# Patient Record
Sex: Male | Born: 1940 | Race: White | Hispanic: No | Marital: Single | Smoking: Former smoker
Health system: Southern US, Community
[De-identification: ages and names within clinical notes are randomized; demographics above are authoritative.]

## PROBLEM LIST (undated history)

## (undated) DIAGNOSIS — K219 Gastro-esophageal reflux disease without esophagitis: Secondary | ICD-10-CM

## (undated) DIAGNOSIS — C44601 Unspecified malignant neoplasm of skin of unspecified upper limb, including shoulder: Secondary | ICD-10-CM

## (undated) DIAGNOSIS — M1990 Unspecified osteoarthritis, unspecified site: Secondary | ICD-10-CM

## (undated) DIAGNOSIS — J449 Chronic obstructive pulmonary disease, unspecified: Secondary | ICD-10-CM

## (undated) DIAGNOSIS — R011 Cardiac murmur, unspecified: Secondary | ICD-10-CM

## (undated) DIAGNOSIS — IMO0002 Reserved for concepts with insufficient information to code with codable children: Secondary | ICD-10-CM

## (undated) DIAGNOSIS — I2510 Atherosclerotic heart disease of native coronary artery without angina pectoris: Secondary | ICD-10-CM

## (undated) DIAGNOSIS — I219 Acute myocardial infarction, unspecified: Secondary | ICD-10-CM

## (undated) DIAGNOSIS — I10 Essential (primary) hypertension: Secondary | ICD-10-CM

## (undated) HISTORY — PX: CHOLECYSTECTOMY: SHX55

## (undated) HISTORY — DX: Atherosclerotic heart disease of native coronary artery without angina pectoris: I25.10

## (undated) HISTORY — DX: Unspecified osteoarthritis, unspecified site: M19.90

## (undated) HISTORY — DX: Essential (primary) hypertension: I10

## (undated) HISTORY — DX: Gastro-esophageal reflux disease without esophagitis: K21.9

## (undated) HISTORY — DX: Cardiac murmur, unspecified: R01.1

## (undated) HISTORY — DX: Chronic obstructive pulmonary disease, unspecified: J44.9

## (undated) HISTORY — DX: Acute myocardial infarction, unspecified: I21.9

## (undated) HISTORY — DX: Reserved for concepts with insufficient information to code with codable children: IMO0002

## (undated) HISTORY — DX: Unspecified malignant neoplasm of skin of unspecified upper limb, including shoulder: C44.601

---
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
## 1973-08-16 HISTORY — PX: HERNIA REPAIR: SHX51

## 1980-08-16 HISTORY — PX: SHOULDER SURGERY: SHX246

## 1994-08-16 HISTORY — PX: FINGER SURGERY: SHX640

## 2005-01-10 HISTORY — PX: ZENKER'S DIVERTICULECTOMY: SHX5223

## 2006-11-11 HISTORY — PX: CORONARY ANGIOPLASTY WITH STENT PLACEMENT: SHX49

## 2008-07-16 HISTORY — PX: TOTAL HIP ARTHROPLASTY: SHX124

## 2008-07-23 ENCOUNTER — Inpatient Hospital Stay (HOSPITAL_COMMUNITY): Admission: RE | Admit: 2008-07-23 | Discharge: 2008-07-29 | Payer: Self-pay | Admitting: Orthopedic Surgery

## 2009-10-20 ENCOUNTER — Encounter: Payer: Self-pay | Admitting: Pulmonary Disease

## 2009-10-23 ENCOUNTER — Encounter: Payer: Self-pay | Admitting: Pulmonary Disease

## 2010-03-23 ENCOUNTER — Encounter: Payer: Self-pay | Admitting: Pulmonary Disease

## 2010-03-25 ENCOUNTER — Encounter: Payer: Self-pay | Admitting: Pulmonary Disease

## 2010-04-08 ENCOUNTER — Ambulatory Visit: Payer: Self-pay | Admitting: Pulmonary Disease

## 2010-04-08 DIAGNOSIS — J189 Pneumonia, unspecified organism: Secondary | ICD-10-CM | POA: Insufficient documentation

## 2010-04-08 DIAGNOSIS — I10 Essential (primary) hypertension: Secondary | ICD-10-CM | POA: Insufficient documentation

## 2010-04-08 DIAGNOSIS — J438 Other emphysema: Secondary | ICD-10-CM

## 2010-04-08 DIAGNOSIS — I219 Acute myocardial infarction, unspecified: Secondary | ICD-10-CM | POA: Insufficient documentation

## 2010-04-08 LAB — CONVERTED CEMR LAB
BUN: 10 mg/dL (ref 6–23)
CO2: 30 meq/L (ref 19–32)
Chloride: 102 meq/L (ref 96–112)
Creatinine, Ser: 1 mg/dL (ref 0.4–1.5)
Glucose, Bld: 99 mg/dL (ref 70–99)
Potassium: 4.7 meq/L (ref 3.5–5.1)

## 2010-04-21 ENCOUNTER — Encounter: Payer: Self-pay | Admitting: Pulmonary Disease

## 2010-04-24 ENCOUNTER — Telehealth (INDEPENDENT_AMBULATORY_CARE_PROVIDER_SITE_OTHER): Payer: Self-pay | Admitting: *Deleted

## 2010-05-04 ENCOUNTER — Encounter: Payer: Self-pay | Admitting: Pulmonary Disease

## 2010-05-04 ENCOUNTER — Ambulatory Visit: Payer: Self-pay | Admitting: Pulmonary Disease

## 2010-06-22 ENCOUNTER — Ambulatory Visit: Payer: Self-pay | Admitting: Pulmonary Disease

## 2010-06-22 DIAGNOSIS — R042 Hemoptysis: Secondary | ICD-10-CM | POA: Insufficient documentation

## 2010-06-30 ENCOUNTER — Encounter: Payer: Self-pay | Admitting: Pulmonary Disease

## 2010-06-30 ENCOUNTER — Ambulatory Visit (HOSPITAL_COMMUNITY)
Admission: RE | Admit: 2010-06-30 | Discharge: 2010-06-30 | Payer: Self-pay | Source: Home / Self Care | Admitting: Pulmonary Disease

## 2010-07-01 ENCOUNTER — Telehealth: Payer: Self-pay | Admitting: Pulmonary Disease

## 2010-07-06 ENCOUNTER — Ambulatory Visit: Payer: Self-pay | Admitting: Pulmonary Disease

## 2010-09-15 NOTE — Assessment & Plan Note (Signed)
Summary: 2 week follow up with cxr//jwr   Visit Type:  Follow-up Copy to:  pcp Primary Provider/Referring Provider:  Dr. Elana Alm   CC:  2 week follow up. Wesley [name]  History of Present Illness: 70/M, remote smoker ,quit '85 with mod hiatal hernia & recurrent  LLL infiltrate since march 2011. He presented on 10/06/09 with hemoptysis  & LLL infiltrate. CXR on 10/20/09 showed improved but persistent LLL ASD + effusion. CTc hest on 10/23/09 showed ill defined nodular opacities in LLL & LUL with small effusion  CT angio in dec '09 also shows BLL opacities with some chronicity s/o underlying fibrosis  Initital OV - 04/08/10 He returned on 03/25/10 with Lt chest pain & CXR showed persitent infx LLL -improved from prior but not resolved - given levaquin CT chest  sep '09- improved infiltrates with residual scar like markings s/o resolved inflammation/ infection Spirometry showed midl airway obstruction with FEV1 69% has been on advair & spiriva x many years.   July 06, 2010 10:02 AM  Returned on nov 7'11 with chills, hemoptysis, LLL infiltrate  - improved with levaquin on FU CXR. feels much better today, back to baseline swallowing evaln showed zenker's diverticulum, this was endoscopically repaired ? from pt's description by an ENT MD , he does not remember name. I offered him GI opinion but he is reluctant, wants to wait another month & go to see this ENt physician.  Preventive Screening-Counseling & Management  Alcohol-Tobacco     Smoking Status: quit     Packs/Day: 2.5     Year Started: 1957     Year Quit: 1985  Current Medications (verified): 1)  Advair Diskus 250-50 Mcg/dose Aepb (Fluticasone-Salmeterol) .... Inhale 1 Puff Two Times A Day 2)  Spiriva Handihaler 18 Mcg Caps (Tiotropium Bromide Monohydrate) .... Once Daily 3)  Iron 28 Mg Tabs (Ferrous Sulfate) .... Take 1 Tablet By Mouth Once A Day 4)  Diltiazem Hcl Er Beads 240 Mg Xr24h-Cap (Diltiazem Hcl Er Beads) .... Take 1 Tablet By Mouth  Once A Day 5)  Plavix 75 Mg Tabs (Clopidogrel Bisulfate) .... Take 1 Tablet By Mouth Once A Day 6)  Doxazosin Mesylate 2 Mg Tabs (Doxazosin Mesylate) .... Take 1 Tablet By Mouth Once A Day 7)  Omeprazole 20 Mg Cpdr (Omeprazole) .... Take 1 Tablet By Mouth Once A Day 8)  Aspirin 325 Mg  Tabs (Aspirin) .... Take 1 Tablet By Mouth Once A Day 9)  Singulair 10 Mg Tabs (Montelukast Sodium) .... Take 1 Tablet By Mouth Once A Day 10)  Lipitor 20 Mg Tabs (Atorvastatin Calcium) .... Take 1 Tablet By Mouth Once A Day 11)  Furosemide 20 Mg Tabs (Furosemide) .... Take 1 Tablet By Mouth Once A Day 12)  Calcium-D 600-200 Mg-Unit Tabs (Calcium Carbonate-Vitamin D) .... Take 1 Tablet By Mouth Two Times A Day 13)  Forteo 600 Mcg/2.38ml Soln (Teriparatide (Recombinant)) .... At Bedtime 14)  Glucosamine 500 Mg Caps (Glucosamine Sulfate) .... 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   Review of Systems  The patient denies anorexia, fever, weight loss, weight gain, vision loss, decreased hearing, hoarseness, chest pain, syncope, dyspnea on exertion, peripheral edema, prolonged cough, headaches, hemoptysis,  abdominal pain, melena, hematochezia, severe indigestion/heartburn, hematuria, muscle weakness, suspicious skin lesions, transient blindness, difficulty walking, depression, unusual weight change, abnormal bleeding, enlarged lymph nodes, and angioedema.    Vital Signs:  Patient profile:   70 year old male Height:      66 inches Weight:      187.4 pounds BMI:     30.36 O2 Sat:      96 % on Room air Temp:     97.7 degrees F oral Pulse  rate:   71 / minute BP sitting:   130 / 78  (left arm) Cuff size:   regular  Vitals Entered By: Zackery Barefoot CMA (July 06, 2010 9:10 AM)  O2 Flow:  Room air CC: 2 week follow up.  Comments Medications reviewed with patient Verified contact number and pharmacy with patient Zackery Barefoot CMA  July 06, 2010 9:11 AM    Physical Exam  Additional Exam:  Gen. Pleasant, well-nourished, in no distress ENT - no lesions, no post nasal drip Neck: No JVD, no thyromegaly, no carotid bruits Lungs: no use of accessory muscles, no dullness to percussion, lt LL rales, nor rhonchi  Cardiovascular: Rhythm regular, heart sounds  normal, no murmurs or gallops, no peripheral edema Musculoskeletal: No deformities, no cyanosis or clubbing      Impression & Recommendations:  Problem # 1:  PNEUMONIA, ORGAN UNSPECIFIED (ICD-486)  Recurrent pna esp LLL  Modified Ba swallow points to zenker's diverticulum as cause , rather than reflux/ hiatal hernia Recommend GI evaluation but he would prefer to see his old ENT surgeon - I could not persuade him , emphasised importance of this multiple times.  Orders: Est. Patient Level III (16109)  Problem # 2:  EMPHYSEMA (ICD-492.8)  gold stg II CT scan suggests underlying lung disease ? fibrosis  Orders: Est. Patient Level III (60454)  Medications Added to Medication List This Visit: 1)  Glucosamine 500 Mg Caps (Glucosamine sulfate) .... Take 1 tablet by mouth once a day  Patient Instructions: 1)  Copy sent to: Dr Elana Alm 2)  Please schedule a follow-up appointment in 3-4 months. 3)  XRay is improving 4)  I would recommend that you get evaluation from a gastro-enterologist for Zenker's diverticulum - this is likley causing your recurrent pneumonia

## 2010-09-15 NOTE — Assessment & Plan Note (Signed)
Summary: HEMOPTYSIS///KP   Visit Type:  Sick Visit Copy to:  pcp Primary Provider/Referring Provider:  Dr. Elana Alm   CC:  Pt c/o nausea, vomiting, "dry heaves", spitting up blood, fever, chills, starting Saturday. Also c/o left lower back pain, productive cough with light green mucus mixed with blood, and increased SOB all the time. Started Mucinex yesterday.  History of Present Illness: 69/M, remote smoker ,quit '85 with mod hiatal hernia & recurrent  LLL infiltrate since march 2011. He presented on 10/06/09 with hemoptysis  & LLL infiltrate. CXR on 10/20/09 showed improved but persistent LLL ASD + effusion. CTc hest on 10/23/09 showed ill defined nodular opacities in LLL & LUL with small effusion  CT angio in dec '09 also shows BLL opacities with some chronicity s/o underlying fibrosis  Initital OV - 04/08/10 He returned on 03/25/10 with Lt chest pain & CXR showed persitent infx LLL -improved from prior but not resolved - given levaquin has been on advair & spiriva x many years. 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 Also c/o left lower back pain, productive cough with light green mucus mixed with blood, and increased SOB all the time. Started Mucinex yesterday. CXR - LLL infx  Preventive Screening-Counseling & Management  Alcohol-Tobacco     Smoking Status: quit     Packs/Day: 2.5     Year Started: 1958     Year Quit: 1985  Current Medications (verified): 1)  Advair Diskus 250-50 Mcg/dose Aepb (Fluticasone-Salmeterol) .... Inhale 1 Puff Two Times A Day 2)  Spiriva Handihaler 18 Mcg Caps (Tiotropium Bromide Monohydrate) .... Once Daily 3)  Iron 28 Mg Tabs (Ferrous Sulfate) .... Take  1 Tablet By Mouth Once A Day 4)  Diltiazem Hcl Er Beads 240 Mg Xr24h-Cap (Diltiazem Hcl Er Beads) .... Take 1 Tablet By Mouth Once A Day 5)  Plavix 75 Mg Tabs (Clopidogrel Bisulfate) .... Take 1 Tablet By Mouth Once A Day 6)  Doxazosin Mesylate 2 Mg Tabs (Doxazosin Mesylate) .... Take 1 Tablet By Mouth Once A Day 7)  Omeprazole 20 Mg Cpdr (Omeprazole) .... Take 1 Tablet By Mouth Once A Day 8)  Aspirin 325 Mg  Tabs (Aspirin) .... Take 1 Tablet By Mouth Once A Day 9)  Singulair 10 Mg Tabs (Montelukast Sodium) .... Take 1 Tablet By Mouth Once A Day 10)  Lipitor 20 Mg Tabs (Atorvastatin Calcium) .... Take 1 Tablet By Mouth Once A Day 11)  Furosemide 20 Mg Tabs (Furosemide) .... Take 1 Tablet By Mouth Once A Day 12)  Calcium-D 600-200 Mg-Unit Tabs (Calcium Carbonate-Vitamin D) .... Take 1 Tablet By Mouth Two Times A Day 13)  Forteo 600 Mcg/2.33ml Soln (Teriparatide (Recombinant)) .... 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   Review of Systems       The patient complains of fever, dyspnea on exertion, and prolonged cough.  The patient denies anorexia, weight loss, weight gain, vision loss, decreased hearing, hoarseness, chest pain,  syncope, peripheral edema, headaches, hemoptysis, abdominal pain, melena, hematochezia, severe indigestion/heartburn, hematuria, muscle weakness, suspicious skin lesions, difficulty walking, depression, unusual weight change, abnormal bleeding, enlarged lymph nodes, and angioedema.    Vital Signs:  Patient profile:   70 year old male Height:      66 inches Weight:      187.4 pounds BMI:     30.36 O2 Sat:      95 % on  Room air Temp:     98.6 degrees F oral Pulse rate:   88 / minute BP sitting:   120 / 68  (left arm) Cuff size:   regular  Vitals Entered By: Zackery Barefoot CMA (June 22, 2010 2:24 PM)  O2 Flow:  Room air CC: Pt c/o nausea, vomiting, "dry heaves", spitting up blood, fever, chills, starting Saturday. Also c/o left lower back pain, productive cough with light green mucus mixed with blood, increased SOB all the time. Started Mucinex yesterday Comments Medications reviewed with patient Verified contact number and pharmacy with patient Zackery Barefoot CMA  June 22, 2010 2:24 PM    Physical Exam  Additional Exam:  Gen. Pleasant, well-nourished, in no distress ENT - no lesions, no post nasal drip Neck: No JVD, no thyromegaly, no carotid bruits Lungs: no use of accessory muscles, no dullness to percussion, lt LL rales, nor rhonchi  Cardiovascular: Rhythm regular, heart sounds  normal, no murmurs or gallops, no peripheral edema Musculoskeletal: No deformities, no cyanosis or clubbing      CXR  Procedure date:  06/22/2010  Findings:      IMPRESSION:   1.  New multifocal air space opacities in the left lung may represent pneumonia or aspirated blood.  Radiographic followup is recommended to exclude underlying neoplasm. 2.  No evidence of pleural effusion or adenopathy.  Impression & Recommendations:  Problem # 1:  PNEUMONIA, ORGAN UNSPECIFIED (ICD-486) Recurrent pna esp LLL - will r/o susual suspects - sinuses, aspiration - If neg - consider inspection bscopy (will have to stop plavix prior) Modified Ba swallow evaln for hiatal hernia CT scan suggests underlying lung disease ? fibrosis His updated medication list for this problem includes:    Levaquin 500 Mg Tabs (Levofloxacin) ..... Once daily  Orders: Speech Therapy (Speech Therapy) Est. Patient Level IV (16109) Prescription Created Electronically [phone])  Medications Added to Medication List This Visit: 1)   Levaquin 500 Mg Tabs (Levofloxacin) .... Once daily  Other Orders: T-2 View CXR ([id number])  Patient Instructions: 1)  Copy sent to: Dr Elana Alm 2)  Please schedule a follow-up appointment in 2 weeks with CXR 3)  Swallow testing 4)  We will check your swallowing & your sinuses (in that order ) to find the cause of recurrent pneumonia Prescriptions: LEVAQUIN 500 MG TABS (LEVOFLOXACIN) once daily  #10 x 0   Entered and Authorized by:   Comer Locket Vassie Loll MD   Signed by:   Comer Locket Vassie Loll MD on 06/22/2010   Method used:   Electronically to        [facility] (retail)       [address]       [place], [place]  [address]       Ph: [phone]       Fax: [phone]   RxID:   [id number]

## 2010-09-15 NOTE — Progress Notes (Signed)
Summary: ct disc-  Phone Note Call from Patient Call back at Home Phone [phone]   Caller: Patient Call For: alva Summary of Call: Pt states he had a chest ct on 9/6, wants to know if he needs to bring disc in before his appt on 9/19, pls advise. Initial call taken by: Darletta Moll,  April 24, 2010 1:19 PM  Follow-up for Phone Call        [facility] Vernie Murders  April 24, 2010 1:48 PM   Additional Follow-up for Phone Call Additional follow up Details #1::        Spoke with pt and notified okay to wait until ov with RA to bring the disc.  Pt verbalized understanding. Additional Follow-up by: Vernie Murders,  April 24, 2010 2:08 PM

## 2010-09-15 NOTE — Assessment & Plan Note (Signed)
Summary: f/u CT ///kp   Visit Type:  Follow-up Copy to:  pcp Primary Provider/Referring Provider:  Dr. Elana Alm   CC:  Pt here for follow up of CT done at [facility] .  History of Present Illness: Initital OV - 04/08/10 68/M, remote smoker ,quit '85 presents for evaluation of persistent LLL infiltrate since march 2011. He presented on 10/06/09 with hemoptysis  & LLL infiltrate. CXR on 10/20/09 showed improved but persistent LLL ASD + effusion. CTc hest on 10/23/09 showed ill defined nodular opacities in LLL & LUL with small effusion - favoring infecetion/ inflammation  & short term FU was recommended He returned on 03/25/10 with Lt chest pain & CXR showed persitent infx LLL -improved from prior but not resolved - given levaquin, CTc hest recommended but pt had no money - referred to [place] for further evaluation. has been on advair & spiriva x many years. 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   Review of Systems       The patient complains of dyspnea on exertion.  The patient denies anorexia, fever, weight loss, weight gain, vision loss, decreased hearing, hoarseness, chest pain, syncope, peripheral edema, prolonged cough, headaches,  hemoptysis, abdominal pain, melena, hematochezia, severe indigestion/heartburn, hematuria, muscle weakness, suspicious skin lesions, difficulty walking, depression, unusual weight change, abnormal bleeding, enlarged lymph nodes, and angioedema.    Vital Signs:  Patient profile:   70 year old male Height:      66 inches Weight:      186.38 pounds BMI:     30.19 O2 Sat:      95 % on Room air Temp:     97.8 degrees F oral Pulse rate:   82 / minute BP sitting:   136 / 72  (left arm) Cuff size:   regular  Vitals Entered By: Zackery Barefoot CMA (May 04, 2010 11:24 AM)  O2 Flow:  Room air CC: Pt here for follow up of CT done at [facility]  Comments Medications reviewed with patient Verified contact number and pharmacy with patient Zackery Barefoot CMA  May 04, 2010 11:25 AM    Physical Exam  Additional Exam:  Gen. Pleasant, well-nourished, in no distress ENT - no lesions, no post nasal drip Neck: No JVD, no thyromegaly, no carotid bruits Lungs: no use of accessory muscles, no dullness to percussion, clear without rales or rhonchi  Cardiovascular: Rhythm regular, heart sounds  normal, no murmurs or gallops, no peripheral edema Musculoskeletal: No deformities, no cyanosis or clubbing      Impression & Recommendations:  Problem # 1:  PNEUMONIA, ORGAN UNSPECIFIED (ICD-486)  -resolved Unclear cause for recurrence in aug'11 following initial episode in feb'11 If recurs, again would investigate sinuses, consider cutting down advair to off   Orders: Est. Patient Level  III (84132) Spirometry w/Graph (94010)  Problem # 2:  EMPHYSEMA (ICD-492.8)  spirometry today for baseline lung function ct advair spiriva flu shot  pneumovax 2010 (per pt)  Orders: Est. Patient Level III (44010) Spirometry w/Graph (94010)  Other Orders: Flu Vaccine 54yrs + (27253) Administration Flu vaccine - MCR (G6440)  Patient Instructions: 1)  Copy sent to: Dr Elana Alm 2)  Please  schedule a follow-up appointment as needed. 3)  Spirometry today    Flu Vaccine Consent Questions     Do you have a history of severe allergic reactions to this vaccine? no    Any prior history of allergic reactions to egg and/or gelatin? no    Do you have a sensitivity to the preservative Thimersol? no    Do you have a past history of Guillan-Barre Syndrome? no    Do you currently have an acute febrile illness? no    Have you ever had a severe reaction to latex? no    Vaccine information given and explained to patient? yes    Are you currently pregnant? no    Lot Number:[id number]   Exp Date:02/12/2010   Site Given  Left Deltoid IMedflu  Elray Buba RN  May 04, 2010 12:07 PM

## 2010-09-15 NOTE — Assessment & Plan Note (Signed)
Summary: PERSISTANT LEGION ON CXR ///kp   Visit Type:  Initial Consult Copy to:  pcp Primary Provider/Referring Provider:  Dr. Elana Alm   CC:  Pt here for pulmonary consult. Abnormal cxr.  History of Present Illness: Initital OV - 04/08/10 68/M, remote smoker ,quit '85 presents for evaluation of persistent LLL infiltrate since march 2011. He presented on 10/06/09 with hemoptysis  & LLL infiltrate. CXR on 10/20/09 showed improved but persistent LLL ASD + effusion. CTc hest on 10/23/09 showed ill defined nodular opacities in LLL & LUL with small effusion - favoring infecetion/ inflammation  & short term FU was recommended He returned on 03/25/10 with Lt chest pain & CXR showed persitent infx LLL -improved from prior but not resolved - given levaquin, CTc hest recommended but pt had no money - referred to [place] for further evaluation. He did not desaturate on exertion, denies wt loss or hemoptysis   Preventive Screening-Counseling & Management  Alcohol-Tobacco     Smoking Status: quit     Packs/Day: 2.5     Year Started: 1958     Year Quit: 1985  Current Medications (verified): 1)  Advair Diskus 250-50 Mcg/dose Aepb (Fluticasone-Salmeterol) .... Inhale 1 Puff Two Times A Day 2)  Spiriva Handihaler 18 Mcg Caps (Tiotropium Bromide Monohydrate) .... Once Daily 3)  Iron 28 Mg Tabs (Ferrous Sulfate) .... Take 1 Tablet By Mouth Once A Day 4)  Diltiazem Hcl Er Beads 240 Mg Xr24h-Cap (Diltiazem Hcl Er Beads) .... Take 1 Tablet By Mouth Once A Day 5)  Plavix 75 Mg Tabs (Clopidogrel Bisulfate) .... Take 1 Tablet By Mouth Once A Day 6)  Doxazosin Mesylate 2 Mg Tabs (Doxazosin Mesylate) .... Take 1 Tablet By Mouth Once A Day 7)  Omeprazole 20 Mg Cpdr (Omeprazole) .... Take 1 Tablet By Mouth Once A Day 8)  Aspirin 325 Mg  Tabs (Aspirin) .... Take 1 Tablet By Mouth Once A Day 9)  Singulair 10 Mg Tabs (Montelukast Sodium) .... Take 1 Tablet By Mouth Once A Day 10)  Lipitor 20 Mg Tabs (Atorvastatin Calcium)  .... Take 1 Tablet By Mouth Once A Day 11)  Furosemide 20 Mg Tabs (Furosemide) .... Take 1 Tablet By Mouth Once A Day 12)  Calcium-D 600-200 Mg-Unit Tabs (Calcium Carbonate-Vitamin D) .... Take 1 Tablet By Mouth Two Times A Day 13)  Forteo 600 Mcg/2.20ml Soln (Teriparatide (Recombinant)) .... 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  Smoking Status:  quit Packs/Day:  2.5  Review of Systems       The patient complains of shortness of breath with activity, productive cough, non-productive cough, acid heartburn, and indigestion.  The patient denies shortness of breath at rest, coughing up blood, chest pain, irregular heartbeats, loss of appetite, weight change, abdominal pain, difficulty swallowing, sore throat, tooth/dental problems, headaches, nasal congestion/difficulty breathing through nose, sneezing, itching, ear ache, anxiety, depression, hand/feet swelling, joint stiffness or pain, rash, change in color of mucus, and fever.    Vital Signs:  Patient profile:  70 year old male Height:      66 inches Weight:      187 pounds BMI:     30.29 O2 Sat:      98 % on Room air Temp:     98.4 degrees F oral Pulse rate:   76 / minute BP sitting:   130 / 80  (right arm) Cuff size:   regular  Vitals Entered By: Zackery Barefoot CMA (April 08, 2010 2:21 PM)  O2 Flow:  Room  air  Serial Vital Signs/Assessments:  Comments: Ambulatory Pulse Oximetry  Resting; HR__86___    02 Sat__95%RA___  Lap1 (185 feet)   HR__95___   02 Sat__93%RA___ Lap2 (185 feet)   HR__99___   02 Sat__93%RA___    Lap3 (185 feet)   HR__105___   02 Sat_95%RA____  _X__Test Completed without Difficulty Zackery Barefoot CMA  April 08, 2010 3:04 PM  ___Test Stopped due to:   By: Zackery Barefoot CMA   CC: Pt here for pulmonary consult. Abnormal cxr Comments Medications reviewed with patient Verified contact number and pharmacy with patient Zackery Barefoot CMA  April 08, 2010 2:21 PM     Impression & Recommendations:  Problem # 1:  PNEUMONIA, ORGAN UNSPECIFIED (ICD-486) Persitent since feb 2011  Rpt CT chest to assess & comapre with scan from [place] - if persistent , further investigate incl perhaps bscopy. Orders: Radiology Referral (Radiology) TLB-BMP (Basic Metabolic Panel-BMET) ([id number]) Consultation Level III (04540)  Medications Added to Medication List This Visit: 1)  Advair Diskus 250-50 Mcg/dose Aepb (Fluticasone-salmeterol) .... Inhale 1 puff two times a day 2)  Spiriva Handihaler 18 Mcg Caps (Tiotropium bromide monohydrate) .... Once daily 3)  Iron 28 Mg Tabs (Ferrous sulfate) .... Take 1 tablet by mouth once a day 4)  Diltiazem Hcl Er Beads 240 Mg Xr24h-cap (Diltiazem hcl er beads) .... Take 1 tablet by mouth once a day 5)  Plavix 75 Mg Tabs (Clopidogrel bisulfate) .... Take 1 tablet by mouth once a day 6)  Doxazosin Mesylate 2 Mg Tabs (Doxazosin mesylate) .... Take 1 tablet by mouth once a day 7)  Omeprazole 20 Mg Cpdr (Omeprazole) .... Take 1 tablet by mouth once a day 8)  Aspirin 325 Mg Tabs (Aspirin) .... Take 1 tablet by mouth once a day 9)  Singulair 10 Mg Tabs (Montelukast sodium) .... Take 1 tablet by mouth once a day 10)  Lipitor 20 Mg Tabs (Atorvastatin calcium) .... Take 1 tablet by mouth once a day 11)  Furosemide 20 Mg Tabs (Furosemide) ....  Take 1 tablet by mouth once a day 12)  Calcium-d 600-200 Mg-unit Tabs (Calcium carbonate-vitamin d) .... Take 1 tablet by mouth two times a day 13)  Forteo 600 Mcg/2.47ml Soln (Teriparatide (recombinant)) .... At bedtime  Patient Instructions: 1)  Please schedule a follow-up appointment in 1 week after CT scan 2)  Ambulatory satn 3)  You have patchy pneumonia in the left lower part of your lung - similar or unchanged from march of this year.   Immunization History:  Influenza Immunization History:    Influenza:  historical (05/19/2009)  Pneumovax Immunization History:    Pneumovax:  historical (08/18/2009)

## 2010-09-15 NOTE — Letter (Signed)
Summary: Katherine Roan MD  Katherine Roan MD   Imported By: Lester [place] 04/21/2010 10:51:04  _____________________________________________________________________  External Attachment:    Type:   Image     Comment:   External Document

## 2010-09-15 NOTE — Progress Notes (Signed)
Summary: Keep old GI doctor  Phone Note Outgoing Call   Summary of Call: Let him know that swallow study shows zenker's diverticulum - (he has had surgery for this before). I would like him to see a gastroenterologist -Does he have a preference or should we refer to [facility] ? Initial call taken by: Comer Locket. Vassie Loll MD,  July 01, 2010 10:34 AM  Follow-up for Phone Call        Pt states he wants to return to GI doctor that did his first surgery. Zackery Barefoot CMA  July 01, 2010 5:00 PM   pl ask him to make appt & give [place] name of GI MD  - pl send in records including swallow study results & my last note to pMD & GI mD Follow-up by: Comer Locket Vassie Loll MD,  July 01, 2010 10:06 PM  Additional Follow-up for Phone Call Additional follow up Details #1::        Pt spoke with son and wants to wait until pt is moved which will be around Dec-Jan. Pt does not recall the name of the GI doctor. All records per above faxed to PMD. Zackery Barefoot CMA  July 06, 2010 4:46 PM

## 2010-12-29 NOTE — Consult Note (Signed)
Wesley, [name]               ACCOUNT NO.:  [id number]   MEDICAL RECORD NO.:  [id number]          PATIENT TYPE:  INP   LOCATION:  1616                         FACILITY:  [facility]   PHYSICIAN:  Monte Fantasia, MD  DATE OF BIRTH:  21-Jan-1941   DATE OF CONSULTATION:  07/24/2008  DATE OF DISCHARGE:                                 CONSULTATION   REASON FOR CONSULTATION:  Running low blood pressures, hypotension.  Postop day 2.   COURSE DURING HOSPITAL STAY:  This is a 70 year old Caucasian male.  The  patient was admitted on 07/17/2008 with complaints of left hip pain  secondary to degenerative joint disease.  The patient underwent left hip  total replacement on 07/23/2008.  The consult was called in on the 9th  for patient running low blood pressures in the 70s.  The patient does  not complain of any shortness of breath or chest pains.  No complaints  of nausea or vomiting or abdominal pain.  No complaints of diarrhea.  The patient has an indwelling Foley.  He just complains of minimal pain  at the postoperative site.  No complaints of any diaphoresis or  dizziness.  The patient has not been out of the bed since postop hip  replacement surgery.   PAST MEDICAL HISTORY:  1. Osteoarthritis.  2. COPD.  3. Heart murmur.  4. Coronary artery disease status post stents.  5. GERD.  6. Degenerative joint disease, degenerative disk disease.  7. Gout.   PAST SURGICAL HISTORY:  1. Left knee surgery done in 1959.  2. Hernia surgery done in 1973.  3. Right shoulder surgery done for rotator cuff tear in 1982.  4. Right index surgery done in 1996.   SOCIAL HISTORY:  The patient is divorced, retired.  He lives alone.  He  walks with a stick, however, prefers walking without it.   ALLERGIES:  No known drug allergies.   MEDICATIONS:  Medications at home:  Omeprazole 20 mg p.o. every morning,  Protonix 40 mg twice daily, furosemide 20 mg in the morning.  Diovan 160  mg in the morning.   Simvastatin 40 mg in the morning.  Plavix 75 mg in  the morning.  Diltiazem 240 mg in the morning.  Potassium 10 mEq p.o.  every morning.  Singulair 10 mg p.o. at bedtime.  Doxazosin 2 mg in the  morning.  Spiriva and Advair Diskus 250/50 one puff twice daily.   Medications during the stay in the hospital:  Cefazolin, Ancef 1 gram IV  q.6 started on December 8th, stopped on December 9th.  Plavix 75 mg p.o.  daily.  Colace 100 mg p.o. b.i.d.  Enoxaparin 40 mg p.o. daily.  Ferrous  sulfate 325 mg p.o. t.i.d.  Advair 250/50 one inhalation b.i.d.  Vicodin  one tablet p.o. q.4 hours.  Singulair 10 mg p.o. at bedtime.  Prilosec  40 mg p.o. daily.  MiraLax 17 grams p.o. daily.  Potassium, K-Dur 10 mEq  p.o. daily.  Zocor 40 mg p.o. daily.  Spiriva 18 mcg inhalation daily.  Dilaudid 0.5 mg IV p.r.n. q.2 hours.  Robaxin 500 mg p.o. q.6 hours  p.r.n. pain.   REVIEW OF SYSTEMS:  HEENT has blurred vision, hearing loss and there are  dentures.  No complaints.  Gastrointestinal does have occasional  heartburn.  Cardiovascular:  Denies any chest pain, shortness of breath,  or diaphoresis.  Musculoskeletal examination has pain status post  surgery on the left hip.  Dermatology:  No complaints.  Hematology:  No  complaints.  Gastrointestinal:  The patient has an indwelling Foley  catheter postop.  No complaints.  GU has indwelling Foley catheter with  no complaints.  Gastrointestinal has occasional heartburn.   FAMILY HISTORY:  Heart attack and coronary artery disease in the family.   PHYSICAL EXAMINATION:  VITAL SIGNS:  Temperature of 99.1, heart rate 92,  respirations 17, blood pressure has been 72/40.  HEENT:  Examination shows neck is supple.  Pupils equal, reacting to  light.  No pallor, no lymphadenopathy.  No JVD.  RESPIRATORY:  Examination shows air entry is bilaterally equal.  No  rales.  No rhonchi.  CARDIOVASCULAR:  Examination shows S1, S2 normal.  Regular rate and  rhythm.  No murmurs.   ABDOMEN:  Abdomen is soft.  No guarding, no rigidity, no distention.  EXTREMITIES:  Status post surgery left hip.  No edema in feet.   LABORATORY:  Total WBC 10.1, hemoglobin 9.2, hematocrit of 27.3.  Platelets 155.  Sodium 137, potassium 4.6, chloride 106, bicarbonate is  22, BUN 13, creatinine 2.5.  BNP is less than 30.  Type and cross done.   RADIOLOGIC INVESTIGATIONS DONE DURING THE HOSPITAL STAY:  X-ray of the  pelvis done on July 23, 2008.  Impression:  Satisfactory left hip  replacement.   ASSESSMENT AND PLAN:  1. Hypotension.  2. Acute renal insufficiency.  3. Status post left hip replacement.  4. Coronary artery disease status post stents.  5. Chronic obstructive pulmonary disease.  6. Gastroesophageal reflux disease.  7. Degenerative joint disease.  8. Gout.   We will recommend to have a type and cross x2 units of packed red blood  cells and we will transfuse 1 unit of packed red blood cell transfusion.  Monitor hemoglobin and hematocrit and BMP every 12 hours.  Give IV  fluids normal saline hydration at 100 mL per hour.  Monitor urine input  and output for the same.  Also advised to have renal sonogram to rule  out any acute obstruction.  We will hold off on all antihypertensives,  that is Diovan, Diltiazem, Lasix, and Benicar due to hypotension with  renal insufficiency.  Iron studies, serum iron, ferritin, and TIBC  levels.  We will also send cardiac enzymes x3.  We will continue  recommendations as per the left hip replacement postop surgery  management.  Continue DVT and GI prophylaxis.   It was a pleasure taking care of Mr. Wesley [name] and we thank you very  much for the consult.  We will follow up for consulting view of  hypotension and acute renal insufficiency.      Monte Fantasia, MD  Electronically Signed     MP/MEDQ  D:  07/24/2008  T:  07/24/2008  Job:  [id number]

## 2010-12-29 NOTE — Discharge Summary (Signed)
NAME:  Wesley [name], Wesley [name]               ACCOUNT NO.:  [id number]   MEDICAL RECORD NO.:  [id number]          PATIENT TYPE:  INP   LOCATION:  1616                         FACILITY:  [facility]   PHYSICIAN:  Madlyn Frankel. Charlann Boxer, M.D.  DATE OF BIRTH:  1940/11/27   DATE OF ADMISSION:  07/23/2008  DATE OF DISCHARGE:  07/26/2008                               DISCHARGE SUMMARY   ADMISSION DIAGNOSES:  1. Osteoarthritis.  2. Chronic obstructive pulmonary disease.  3. Heart murmur.  4. Coronary artery disease with heart stenting.  5. Reflux disease.  6. Degenerative disk disease.  7. Gout.   DISCHARGE DIAGNOSES:  1. Osteoarthritis.  2. Chronic obstructive pulmonary disease.  3. Heart murmur.  4. Coronary artery disease with heart stenting.  5. Reflux disease.  6. Degenerative disk disease.  7. Gout.  8. Acute blood-loss anemia.  9. Acute renal insufficiency, resolved prior to discharge.  10.Hypotension, resolved.   HISTORY OF PRESENT ILLNESS:  This is a 70 year old male with a history  of left hip pain secondary to degenerative joint disease.  It was  refractory to all conservative treatment.  He was admitted to the  hospital for a left total hip replacement.   LABORATORY DATA:  CBC:  Final readings of white blood cells 8.9,  hemoglobin 8.8, hematocrit 26.1, platelets 147.  Metabolic panel, final  reading:  Sodium 139, potassium 4.3, creatinine 1.22, glucose 119.  Cardiac markers:  Troponin 0.11.  CK 741, CK-MB 5.6.  Calcium 7.7.  Iron  12.  BNP less than 30.   RADIOLOGY:  Chest, two-view:  COPD.  No active cardiopulmonary disease.   CARDIOLOGY:  EKG done on September 22, 2007 showed sinus rhythm with  lateral ST changes, nonspecific borderline ECG.  He had previous full  cardiology workup.   HOSPITAL COURSE:  Patient admitted to the hospital and had a left total  hip replacement.  He was admitted to the orthopedic floor.  He did  remain afebrile throughout his course of stay, but he did have  acute  blood loss anemia and was transfused 2 units of blood.  Medicine came  along to follow due to some significant hypotension.  It all resolved  during his course of stay.  He was given multiple boluses.  Renal  ultrasound showed no acute findings.  With physical therapy, he was 50%  weightbearing.  An iron study was done on the 9th.  It showed his  ferritin level of 12.  Medicine followed throughout his course of stay.  When we saw him on the 11th, orthopedically, he was stable.  Hemodynamically, he was stable but will require long-term use of iron.  His dressing was changed.  There was some dried blood but otherwise  wound with no active drainage.  He was neurovascularly intact to his  left lower extremity.  Continue partial weightbearing of 50% and ready  for discharge to skilled nursing facility.   DISCHARGE DISPOSITION:  Discharge to skilled nursing facility rehab  stable in improved condition.   DISCHARGE DIET:  Heart-healthy.   DISCHARGE WOUND CARE:  Keep wound dry.  Change  dressing on a daily  basis.   DISCHARGE PHYSICAL THERAPY:  He is partial weightbearing at 50% with the  use of a rolling walker.  Goals of physical therapy will be to increase  strength and increase balance and encourage independence and activities  of daily living.  He will be utilizing a rolling walker while he is 50%  weightbearing.   DISCHARGE MEDICATIONS:  1. Robaxin 500 mg 1 p.o. q.6h. muscle spasm, pain.  2. Iron 325 mg 1 p.o. t.i.d.  3. Colace 100 mg 1 p.o. b.i.d. p.r.n. constipation while on narcotics.  4. MiraLax 17 gm 1 p.o. daily p.r.n. constipation while on narcotics.  5. Norco 7.5/325 1-2 p.o. q.4-6h. p.r.n. pain.  6. Omeprazole 20 mg 2 p.o. q.a.m.  7. Protonix 40 mg p.o. b.i.d.  8. Furosemide 20 mg 1 p.o. daily.  Hold while systolic is less than      120.  9. Diovan 160 mg 1 p.o. q.a.m.  Hold while systolic is less than 120.  10.Simvastatin 40 mg 1 p.o. q.a.m.  11.Plavix 75 mg 1  p.o. q.a.m.  12.Diltiazem 240 mg p.o. q.a.m.  13.Potassium 10 mEq p.o. q.a.m.  14.Singulair 10 mg 1 p.o. nightly.  15.Doxazosin 2 mg p.o. q.a.m.  16.Spiriva hand-held inhaler to use in the morning.  17.Advair Discus 250/50 1 puff twice daily.  18.Calcium 600 plus vitamin D 800 p.o. b.i.d.  19.Forteo injection continue.   DISCHARGE FOLLOWUP:  1. Follow up with Dr. Charlann Boxer at phone number [phone] in two weeks.  2. Follow up with medical doctor due to iron deficiency with a      previous ferritin reading of 12.     ______________________________  Yetta Glassman. [address], [place]      Madlyn Frankel. Charlann Boxer, M.D.  Electronically Signed    BLM/MEDQ  D:  07/26/2008  T:  07/26/2008  Job:  [id number]   cc:   S. Kyra Manges, M.D.  Fax: [phone]

## 2010-12-29 NOTE — Discharge Summary (Signed)
NAME:  Wesley [name], Wesley [name]               ACCOUNT NO.:  [id number]   MEDICAL RECORD NO.:  [id number]          PATIENT TYPE:  INP   LOCATION:  1616                         FACILITY:  [facility]   PHYSICIAN:  Madlyn Frankel. Charlann Boxer, M.D.  DATE OF BIRTH:  01-10-1941   DATE OF ADMISSION:  07/23/2008  DATE OF DISCHARGE:                               DISCHARGE SUMMARY   ADDENDUM   Wesley [name] remained in the hospital as a bed was found.  There are no  other interval changes.  He is making minimal progress with physical  therapy.   He had a lab draw, and he was having good creatinine clearance with a  creatinine of 1.17.  Dressing was changed.  The wound had no active  drainage.  He continued to be 50% weightbearing.  No other changes  noted.   Should discharge to skilled nursing facility in stable and improved  condition.     ______________________________  [name], [place]      Madlyn Frankel. Charlann Boxer, M.D.  Electronically Signed    BLM/MEDQ  D:  07/27/2008  T:  07/27/2008  Job:  [id number]

## 2010-12-29 NOTE — H&P (Signed)
NAME:  Wesley [name], Wesley [name]               ACCOUNT NO.:  [id number]   MEDICAL RECORD NO.:  [id number]          PATIENT TYPE:  INP   LOCATION:  NA                           FACILITY:  [facility]   PHYSICIAN:  Madlyn Frankel. Charlann Boxer, M.D.  DATE OF BIRTH:  25-Aug-1940   DATE OF ADMISSION:  07/23/2008  DATE OF DISCHARGE:                              HISTORY & PHYSICAL   PROCEDURE:  Left total hip replacement.   CHIEF COMPLAINT:  Left hip pain.   HISTORY OF PRESENT ILLNESS:  This is a 70 year old male with history of  left hip pain secondary to degenerative joint disease.  This has been  refractory to all conservative treatment.  The patient has been pre-  surgically assessed by Dr. Kyra Manges.   PAST MEDICAL HISTORY:  Significant for:  1. Osteoarthritis.  2. COPD.  3. Heart murmur.  4. Coronary artery disease with heart stenting.  5. Reflux disease.  6. Degenerative disc disease.  7. Gout.   PAST SURGICAL HISTORY:  1. Left knee surgery in 1959.  2. Hernia surgery in 1973.  3. Right shoulder surgery in 1982.  4. Right index finger 1996.   FAMILY HISTORY:  Heart attack, coronary artery disease.   SOCIAL HISTORY:  Divorced, retired for 12 years.  He lives alone.  Will  require home versus rehab facility postoperatively.  He has not picked  out a facility.   DRUG ALLERGIES:  No known drug allergies.   MEDICATIONS:  Patient is unsure of dosages and frequency.  He does not  bring medications with him.  Will bring them at the time of admission.  His medications include:  1. Naprosyn.  2. Plavix.  3. Aspirin.  4. Diovan.  5. Diltiazem.  6. Advair.  7. Calcium plus vitamin D.  8. Zocor.  9. Fosamax.  10.Lasix.  11.Iron.  12.Prilosec.  13.Spiriva.  14.Potassium.   Verify dosages and frequency with the patient at the time of admission.   REVIEW OF SYSTEMS:  HEENT:  Has blurred vision, hearing loss and wears  dentures.  DERMATOLOGY:  Has rashes and itching.  RESPIRATORY:  He has  shortness of breath on exertion.  GASTROINTESTINAL:  He has heartburn.  MUSCULOSKELETAL:  He has joint pain, back pain, muscular weakness.  Otherwise, see HPI.   PHYSICAL EXAMINATION:  VITAL SIGNS:  Pulse 60.  Respirations  16.  Blood  pressure 84/46 left arm sitting, repeated.  GENERAL:  Awake, alert and oriented, well-developed, well-nourished.  HEENT:  Normocephalic.  NECK:  Supple.  No carotid bruits.  CHEST:  Lungs clear to auscultation bilaterally.  BREASTS:  Deferred.  HEART:  Regular rate and rhythm, S1, S2 distinct.  ABDOMEN:  Bowel sounds present.  PELVIC:  Stable.  GENITOURINARY:  Deferred.  EXTREMITIES:  Left hip has increased pain with decreased range of  motion.  SKIN:  No cellulitis.  NEUROLOGICAL:  Intact distal sensibilities.   LABORATORY DATA:  Labs, EKG and chest x-ray all pending pre-surgical  testing.   IMPRESSION:  Left hip degenerative joint disease.   PLAN OF ACTION:  Left total hip replacement at [facility] on  July 23, 2008 by surgeon Dr. Durene Romans.  Risks and complications  were discussed.   No postoperative medications were provided.  Patient is a candidate for  potential SNF rehab postoperatively.     ______________________________  Wesley [name] [address], [place]      Madlyn Frankel. Charlann Boxer, M.D.  Electronically Signed    BLM/MEDQ  D:  07/17/2008  T:  07/17/2008  Job:  [id number]   cc:   S. Kyra Manges, M.D.  Fax: [phone]

## 2010-12-29 NOTE — Op Note (Signed)
NAME:  Wesley [name], CUARESMA               ACCOUNT NO.:  [id number]   MEDICAL RECORD NO.:  [id number]          PATIENT TYPE:  INP   LOCATION:  0007                         FACILITY:  [facility]   PHYSICIAN:  Madlyn Frankel. Charlann Boxer, M.D.  DATE OF BIRTH:  1941-07-08   DATE OF PROCEDURE:  07/23/2008  DATE OF DISCHARGE:                               OPERATIVE REPORT   PREOPERATIVE DIAGNOSIS:  Left hip avascular necrosis.   POSTOPERATIVE DIAGNOSIS:  Left hip avascular necrosis.   PROCEDURE:  Left total hip replacement.   COMPONENTS USED:  DePuy hip system with a size 52 Pinnacle cup, 2  cancellous bone screws and a 36 metal neutral liner, a 9 high Trilock  stem with 36 1.5 aSphere ball.   SURGEON:  Madlyn Frankel. Charlann Boxer, M.D.   ASSISTANT:  Yetta Glassman. Mann, PA.   ANESTHESIA:  General.   BLOOD LOSS:  325.   SPECIMENS:  None.   COMPLICATIONS:  None.   DRAINS:  1 Hemovac.   INDICATIONS FOR PROCEDURE:  The patient is a 70 year old gentleman who  presented to the office for evaluation of hip pain.  His hip pain was  progressive.  Radiographs revealed large cystic changes within the  femoral head consistent with the diagnosis of AVN with associated  flattening.  He was offered conservative measures.  Once these failed,  he wished to proceed with arthroplasty.  The risks of infection, DVT,  need for revision surgery were all discussed and consent was obtained  for the benefit of pain relief.   PROCEDURE IN DETAIL:  The patient was brought to the operative theater.  Once adequate anesthesia, preoperative antibiotics, Ancef was  administered.  The patient was positioned in the right lateral decubitus  position with the left side up left.  The left lower extremity pre  scrubbed and prepped and draped in a sterile fashion.   A time-out was carried out, identifying the patient's extremity.   The left lateral incision was made for a posterior approach to the hip.  The iliotibial band and gluteal fascia were  incised posteriorly through  this hip.  The short external rotators were identified and taken down  separately from the posterior capsule.  An L capsulotomy was made,  preserving the posterior leaflet to protect the sciatic nerve from  retraction.  However, I was unable to repair it anatomically due to  debridement of the labral tissue, some soft tissues proximally.   The hip was dislocated and a neck osteotomy made based off anatomic  landmarks utilizing a broach with the femoral head in place, identifying  correct lengthening.   The femoral head was noted to have severe flattening and subchondral  collapse.   Attention was first directed to the femur and femoral retractors were  placed.  I removed bone off the lateral aspect of the neck and began  broaching.  The broaching was carried up initially all the way up to an  8.   I packed off the femur and now attended to the acetabulum.  Acetabular  exposure was obtained.  The labrum was removed and some more  of this  proximal tissue was removed.  I began reaming with a 46 reamer and  reamed up to a 51 reamer with good bony bed preparation.  A 52 Pinnacle  cup was then impacted in approximately 20 degrees of forward flexion and  40-35 degrees of abduction.  This was checked and confirmed with the hip  guide.  I placed 2 cancellous screws to support this initial scratch  fit.  The hole eliminator was placed and the final 36 neutral liner  impacted.   Attention was now directed back to the femur, where a broach was  positioned.  When I impacted it down it subsided a couple of millimeters  so  I chose to go up to a size 9.  Trial reduction was carried out with  the 9 broach high offset neck and 36 1.5 ball.  Hip stability appeared  to be very stable.  Leg lengths appeared to be comparable to her down  leg as well as compared to the preoperative position.   There was no evidence impingement with forward flexion, internal  rotation nor  with external rotation, extension.  Given these findings,  the trial broach and head and neck were removing and the final 9 high  stem opened.  It was impacted to the level where the broach was and  based on this, I went ahead and impacted the to 36 1.5 ball onto a clean  and dry trunnion.  The hip was reduced.  The hip was irrigated  throughout the case and again at this point.  I tried to reapproximate  the posterior capsule based on the labral debridement and soft tissue  debridement proximally.  This capsular tissue proximally had been  removed and I was unable to reapproximate it.  I thus removed a portion  of this impingement.   There was some oozing into the medial aspect of the tissues but there  was no active hemostasis required.  A Hemovac drain was placed instead.  The iliotibial band and gluteal fascia were then reapproximated using #1  Vicryl.  The remainder of the wound was closed with 2-0 Vicryl and then  running 4-0 Monocryl.  The hip was cleaned, dried and Steri-Strips and a  bulky Mepilex dressing applied.  He was then extubated and brought to  the recovery room stable.      Madlyn Frankel Charlann Boxer, M.D.  Electronically Signed     MDO/MEDQ  D:  07/23/2008  T:  07/23/2008  Job:  [id number]

## 2011-03-15 ENCOUNTER — Encounter: Payer: Self-pay | Admitting: Emergency Medicine

## 2011-03-16 ENCOUNTER — Encounter: Payer: Self-pay | Admitting: Emergency Medicine

## 2011-03-16 ENCOUNTER — Ambulatory Visit (INDEPENDENT_AMBULATORY_CARE_PROVIDER_SITE_OTHER)
Admission: RE | Admit: 2011-03-16 | Discharge: 2011-03-16 | Disposition: A | Discharge status: Home / Self Care | Payer: Medicare Other | Source: Ambulatory Visit | Attending: Emergency Medicine | Admitting: Emergency Medicine

## 2011-03-16 ENCOUNTER — Ambulatory Visit (INDEPENDENT_AMBULATORY_CARE_PROVIDER_SITE_OTHER): Payer: Medicare Other | Admitting: Emergency Medicine

## 2011-03-16 DIAGNOSIS — J189 Pneumonia, unspecified organism: Secondary | ICD-10-CM

## 2011-03-16 DIAGNOSIS — R042 Hemoptysis: Secondary | ICD-10-CM

## 2011-03-16 MED ORDER — MOXIFLOXACIN HCL 400 MG PO TABS: 400.0000 mg | ORAL_TABLET | Freq: Every day | ORAL | Status: DC

## 2011-03-16 NOTE — Assessment & Plan Note (Signed)
Recurrent LLL infiltrate with green bloody sputum, infectious prodrome, consistent w L CAP. Recurrent, ? Whether he is at risk for aspiration, may need to be worked up later for this.

## 2011-03-16 NOTE — Progress Notes (Signed)
69/M, remote smoker ,quit '85 with mod hiatal hernia & recurrent LLL infiltrate since march 2011.  He presented on 10/06/09 with hemoptysis & LLL infiltrate. CXR on 10/20/09 showed improved but persistent LLL ASD + effusion. 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   July 06, 2010 10:02 AM  Returned on nov 7'11 with chills, hemoptysis, LLL infiltrate - improved with levaquin on FU CXR.  feels much better today, back to baseline  swallowing evaln showed zenker's diverticulum, this was endoscopically repaired ? from pt's description by an ENT MD , he does not remember name. I offered him GI opinion but he is reluctant, wants to wait another month & go to see this ENt physician.  Acute Visit 03/16/11 -- Hx mild AFL, COPD, treated for LLL PNA as above in 2011. Has been maintained on Spiriva + Advair. Was feeling well until about 6 days ago - developed N/V, low grade fever, evolved into L back pain with cough prod of dark green mucous, some bloody streaks. Breathing worse over last several days.   EXAM  Gen: Pleasant, well-nourished, in no distress,  normal affect  ENT: No lesions,  mouth clear,  oropharynx clear, no postnasal drip  Neck: No JVD, no TMG, no carotid bruits  Lungs: No use of accessory muscles, insp crackles at L base.   Cardiovascular: RRR, heart sounds normal, no murmur or gallops, no peripheral edema  Musculoskeletal: No deformities, no cyanosis or clubbing  Neuro: alert, non  focal  Skin: Warm, no lesions or rashes    CXR : worsening of LLL peripheral infiltrate compared with prior film.

## 2011-03-16 NOTE — Patient Instructions (Addendum)
We will start Avelox 400mg  daily fo rthe next 7 days.  Follow up in our office with either Dr Vassie Loll or with Andrey Campanile, NP, next week.  Continue your inhaled medications.

## 2011-03-23 ENCOUNTER — Ambulatory Visit (INDEPENDENT_AMBULATORY_CARE_PROVIDER_SITE_OTHER)
Admission: RE | Admit: 2011-03-23 | Discharge: 2011-03-23 | Disposition: A | Discharge status: Home / Self Care | Payer: Medicare Other | Source: Ambulatory Visit | Attending: Pulmonary Disease | Admitting: Pulmonary Disease

## 2011-03-23 ENCOUNTER — Ambulatory Visit (INDEPENDENT_AMBULATORY_CARE_PROVIDER_SITE_OTHER): Payer: Medicare Other | Admitting: Pulmonary Disease

## 2011-03-23 ENCOUNTER — Encounter: Payer: Self-pay | Admitting: Pulmonary Disease

## 2011-03-23 VITALS — BP 120/60 | HR 75 | Temp 98.0°F | Ht 66.0 in | Wt 185.0 lb

## 2011-03-23 DIAGNOSIS — J189 Pneumonia, unspecified organism: Secondary | ICD-10-CM

## 2011-03-23 MED ORDER — MOXIFLOXACIN HCL 400 MG PO TABS: 400.0000 mg | ORAL_TABLET | Freq: Every day | ORAL | Status: AC

## 2011-03-23 NOTE — Patient Instructions (Addendum)
Pneumonia appears better WOuld advise you to see a gastroenterologist to see if anything can be done about your aspiration causing recurrent pneumonias 3 more days of antibiotic Xray in 2 weeks with dr Elana Alm

## 2011-03-23 NOTE — Progress Notes (Signed)
  Subjective:    Patient ID: Wesley [name], male    DOB: June 02, 1941, 70 y.o.   MRN: [id number]  HPI 69/M, remote smoker ,quit '85  with mild COPD, mod hiatal hernia & recurrent LLL infiltrate since march 2011.   This has happened in feb'11 , then again in aug'11, nov'11 &  8/12 CT chest on 10/23/09 showed ill defined nodular opacities in LLL & LUL with small effusion  CT angio in dec '09 also shows BLL opacities with some chronicity s/o underlying fibrosis  Spirometry showed midl airway obstruction with FEV1 69%  has been on advair & spiriva x many years.   Initital OV - 04/08/10  Swallowing evaln showed zenker's diverticulum, this was endoscopically repaired ? from pt's description by an ENT MD , he does not remember name. He has been resistant to my repeated recommendations for GI evaluation  Acute Visit 03/16/11 -- Hx mild AFL, COPD, treated for LLL PNA as above in 2011. Has been maintained on Spiriva + Advair. developed N/V, low grade fever, evolved into L back pain with cough prod of dark green mucous, some bloody streaks. Breathing worse over last several days  03/23/2011 1 week follow-up. Pt states his SOB has improved, but not gone and he still has a productive cough with clear phlegm.  Pt took last Avelox this morning. No fevers CXR  Improved LLL infiltrate but not completely gone    Review of Systems Patient denies significant dyspnea,cough, hemoptysis,  chest pain, palpitations, pedal edema, orthopnea, paroxysmal nocturnal dyspnea, lightheadedness, nausea, vomiting, abdominal or  leg pains      Objective:   Physical Exam Gen. Pleasant, well-nourished, in no distress ENT - no lesions, no post nasal drip Neck: No JVD, no thyromegaly, no carotid bruits Lungs: no use of accessory muscles, no dullness to percussion, LLL rales, no rhonchi  Cardiovascular: Rhythm regular, heart sounds  normal, no murmurs or gallops, no peripheral edema Musculoskeletal: No deformities, no cyanosis  or clubbing         Assessment & Plan:

## 2011-03-24 NOTE — Assessment & Plan Note (Signed)
Recurrent LLL Related to aspiration from zenker's diverticulum/ hiatal hernia Now improving on CXR - continue avelox for another 3 days x 10 ds total He will need FU CXR with PCP in 1-2 weeks He has been very reluctatnt for some reason for GI consultation - I emphasised this again - will let his PCP talk him into this - he would like local referral if at all.

## 2011-04-09 ENCOUNTER — Telehealth: Payer: Self-pay | Admitting: Pulmonary Disease

## 2011-04-09 NOTE — Telephone Encounter (Signed)
Forwarded to Dr. Vassie Loll for review.

## 2011-04-29 ENCOUNTER — Telehealth: Payer: Self-pay | Admitting: Pulmonary Disease

## 2011-04-29 DIAGNOSIS — R9389 Abnormal findings on diagnostic imaging of other specified body structures: Secondary | ICD-10-CM

## 2011-04-29 NOTE — Telephone Encounter (Signed)
Let him know CXR with PCP on 04/06/11 showed  Persistent opacity at left base & radiologist recommending CT scan Order without contrast if he is agreeable

## 2011-05-04 NOTE — Telephone Encounter (Signed)
I informed pt of RA's findings and recommendations. Pt verbalized understanding. Pt agrees to have CT and requesting to have same at [facility]. Order placed to [facility].

## 2011-05-17 ENCOUNTER — Telehealth: Payer: Self-pay | Admitting: Pulmonary Disease

## 2011-05-17 NOTE — Telephone Encounter (Signed)
CT chest shows increased scarring in the left lung. Will review images when I can get them from [place] [facility] he is feeling better

## 2011-05-17 NOTE — Telephone Encounter (Signed)
Pt states "feeling fine", c/o productive cough with green mucus, wheezing that goes away after bringing up mucus. Has been able to fill an aspirin bottle multiple times with same.   I called for CD and Tim advised he will mail to [place].

## 2011-05-18 ENCOUNTER — Encounter: Payer: Self-pay | Admitting: Pulmonary Disease

## 2011-05-21 LAB — BASIC METABOLIC PANEL
BUN: 12 mg/dL (ref 6–23)
BUN: 13 mg/dL (ref 6–23)
BUN: 15 mg/dL (ref 6–23)
BUN: 16 mg/dL (ref 6–23)
BUN: 18 mg/dL (ref 6–23)
CO2: 22 mEq/L (ref 19–32)
CO2: 22 mEq/L (ref 19–32)
CO2: 23 mEq/L (ref 19–32)
CO2: 23 mEq/L (ref 19–32)
CO2: 23 mEq/L (ref 19–32)
CO2: 24 mEq/L (ref 19–32)
CO2: 25 mEq/L (ref 19–32)
CO2: 26 mEq/L (ref 19–32)
CO2: 28 mEq/L (ref 19–32)
Calcium: 7.7 mg/dL — ABNORMAL LOW (ref 8.4–10.5)
Calcium: 8 mg/dL — ABNORMAL LOW (ref 8.4–10.5)
Calcium: 8 mg/dL — ABNORMAL LOW (ref 8.4–10.5)
Calcium: 8.7 mg/dL (ref 8.4–10.5)
Calcium: 9.2 mg/dL (ref 8.4–10.5)
Chloride: 101 mEq/L (ref 96–112)
Chloride: 102 mEq/L (ref 96–112)
Chloride: 103 mEq/L (ref 96–112)
Chloride: 104 mEq/L (ref 96–112)
Chloride: 105 mEq/L (ref 96–112)
Chloride: 105 mEq/L (ref 96–112)
Chloride: 105 mEq/L (ref 96–112)
Chloride: 106 mEq/L (ref 96–112)
Chloride: 108 mEq/L (ref 96–112)
Creatinine, Ser: 1.04 mg/dL (ref 0.4–1.5)
Creatinine, Ser: 1.22 mg/dL (ref 0.4–1.5)
Creatinine, Ser: 1.22 mg/dL (ref 0.4–1.5)
Creatinine, Ser: 1.42 mg/dL (ref 0.4–1.5)
Creatinine, Ser: 2.01 mg/dL — ABNORMAL HIGH (ref 0.4–1.5)
GFR calc Af Amer: 40 mL/min — ABNORMAL LOW (ref >60)
GFR calc Af Amer: 60 mL/min — ABNORMAL LOW (ref >60)
GFR calc Af Amer: >60 mL/min (ref >60)
GFR calc Af Amer: >60 mL/min (ref >60)
GFR calc Af Amer: >60 mL/min (ref >60)
GFR calc Af Amer: >60 mL/min (ref >60)
GFR calc Af Amer: >60 mL/min (ref >60)
GFR calc Af Amer: >60 mL/min (ref >60)
GFR calc non Af Amer: 25 mL/min — ABNORMAL LOW (ref >60)
GFR calc non Af Amer: 33 mL/min — ABNORMAL LOW (ref >60)
GFR calc non Af Amer: 50 mL/min — ABNORMAL LOW (ref >60)
GFR calc non Af Amer: 59 mL/min — ABNORMAL LOW (ref >60)
GFR calc non Af Amer: >60 mL/min (ref >60)
GFR calc non Af Amer: >60 mL/min (ref >60)
GFR calc non Af Amer: >60 mL/min (ref >60)
Glucose, Bld: 104 mg/dL — ABNORMAL HIGH (ref 70–99)
Glucose, Bld: 113 mg/dL — ABNORMAL HIGH (ref 70–99)
Glucose, Bld: 119 mg/dL — ABNORMAL HIGH (ref 70–99)
Glucose, Bld: 120 mg/dL — ABNORMAL HIGH (ref 70–99)
Glucose, Bld: 121 mg/dL — ABNORMAL HIGH (ref 70–99)
Glucose, Bld: 124 mg/dL — ABNORMAL HIGH (ref 70–99)
Glucose, Bld: 128 mg/dL — ABNORMAL HIGH (ref 70–99)
Potassium: 3.6 mEq/L (ref 3.5–5.1)
Potassium: 4.2 mEq/L (ref 3.5–5.1)
Potassium: 4.3 mEq/L (ref 3.5–5.1)
Potassium: 4.3 mEq/L (ref 3.5–5.1)
Potassium: 4.4 mEq/L (ref 3.5–5.1)
Potassium: 4.5 mEq/L (ref 3.5–5.1)
Potassium: 4.6 mEq/L (ref 3.5–5.1)
Potassium: 4.8 mEq/L (ref 3.5–5.1)
Potassium: 4.9 mEq/L (ref 3.5–5.1)
Sodium: 133 mEq/L — ABNORMAL LOW (ref 135–145)
Sodium: 134 mEq/L — ABNORMAL LOW (ref 135–145)
Sodium: 135 mEq/L (ref 135–145)
Sodium: 136 mEq/L (ref 135–145)
Sodium: 136 mEq/L (ref 135–145)
Sodium: 137 mEq/L (ref 135–145)
Sodium: 137 mEq/L (ref 135–145)
Sodium: 138 mEq/L (ref 135–145)
Sodium: 139 mEq/L (ref 135–145)
Sodium: 139 mEq/L (ref 135–145)

## 2011-05-21 LAB — HEMOGLOBIN AND HEMATOCRIT, BLOOD
HCT: 26.1 % — ABNORMAL LOW (ref 39.0–52.0)
HCT: 28.3 % — ABNORMAL LOW (ref 39.0–52.0)
Hemoglobin: 9.5 g/dL — ABNORMAL LOW (ref 13.0–17.0)

## 2011-05-21 LAB — DIFFERENTIAL
Basophils Absolute: 0 10*3/uL (ref 0.0–0.1)
Basophils Relative: 1 % (ref 0–1)
Eosinophils Absolute: 0.2 10*3/uL (ref 0.0–0.7)
Monocytes Absolute: 0.3 10*3/uL (ref 0.1–1.0)
Monocytes Relative: 5 % (ref 3–12)
Neutro Abs: 3.4 10*3/uL (ref 1.7–7.7)
Neutrophils Relative %: 59 % (ref 43–77)

## 2011-05-21 LAB — TYPE AND SCREEN: ABO/RH(D): A POS

## 2011-05-21 LAB — CBC
HCT: 27.3 % — ABNORMAL LOW (ref 39.0–52.0)
HCT: 29.3 % — ABNORMAL LOW (ref 39.0–52.0)
Hemoglobin: 9.2 g/dL — ABNORMAL LOW (ref 13.0–17.0)
Hemoglobin: 9.6 g/dL — ABNORMAL LOW (ref 13.0–17.0)
MCHC: 32.9 g/dL (ref 30.0–36.0)
MCHC: 33.4 g/dL (ref 30.0–36.0)
MCHC: 33.9 g/dL (ref 30.0–36.0)
MCV: 91.2 fL (ref 78.0–100.0)
Platelets: 147 10*3/uL — ABNORMAL LOW (ref 150–400)
RBC: 2.98 MIL/uL — ABNORMAL LOW (ref 4.22–5.81)
RBC: 4.33 MIL/uL (ref 4.22–5.81)
RDW: 15.2 % (ref 11.5–15.5)
RDW: 15.3 % (ref 11.5–15.5)
RDW: 15.9 % — ABNORMAL HIGH (ref 11.5–15.5)

## 2011-05-21 LAB — PROTIME-INR: INR: 1 (ref 0.00–1.49)

## 2011-05-21 LAB — CARDIAC PANEL(CRET KIN+CKTOT+MB+TROPI)
Relative Index: 1.1 (ref 0.0–2.5)
Relative Index: 1.3 (ref 0.0–2.5)
Total CK: 850 U/L — ABNORMAL HIGH (ref 7–232)
Troponin I: 0.05 ng/mL (ref 0.00–0.06)
Troponin I: 0.14 ng/mL — ABNORMAL HIGH (ref 0.00–0.06)

## 2011-05-21 LAB — APTT: aPTT: 27 seconds (ref 24–37)

## 2011-05-21 LAB — URINALYSIS, ROUTINE W REFLEX MICROSCOPIC
Bilirubin Urine: NEGATIVE
Hgb urine dipstick: NEGATIVE
Ketones, ur: NEGATIVE mg/dL
Specific Gravity, Urine: 1.023 (ref 1.005–1.030)
pH: 6 (ref 5.0–8.0)

## 2011-05-21 LAB — IRON: Iron: 12 ug/dL — ABNORMAL LOW (ref 42–135)

## 2011-07-23 ENCOUNTER — Ambulatory Visit (INDEPENDENT_AMBULATORY_CARE_PROVIDER_SITE_OTHER)
Admission: RE | Admit: 2011-07-23 | Discharge: 2011-07-23 | Disposition: A | Discharge status: Home / Self Care | Payer: Medicare Other | Source: Ambulatory Visit | Attending: Adult Health | Admitting: Adult Health

## 2011-07-23 ENCOUNTER — Encounter: Payer: Self-pay | Admitting: Internal Medicine

## 2011-07-23 ENCOUNTER — Encounter: Payer: Self-pay | Admitting: Adult Health

## 2011-07-23 ENCOUNTER — Ambulatory Visit (INDEPENDENT_AMBULATORY_CARE_PROVIDER_SITE_OTHER): Payer: Medicare Other | Admitting: Adult Health

## 2011-07-23 VITALS — BP 116/64 | HR 65 | Temp 97.4°F | Ht 66.0 in | Wt 188.4 lb

## 2011-07-23 DIAGNOSIS — J438 Other emphysema: Secondary | ICD-10-CM

## 2011-07-23 DIAGNOSIS — J189 Pneumonia, unspecified organism: Secondary | ICD-10-CM

## 2011-07-23 MED ORDER — DOXYCYCLINE HYCLATE 100 MG PO TABS: 100.0000 mg | ORAL_TABLET | Freq: Two times a day (BID) | ORAL | Status: AC

## 2011-07-23 MED ORDER — PREDNISONE 10 MG PO TABS: ORAL_TABLET | ORAL | Status: DC

## 2011-07-23 NOTE — Progress Notes (Signed)
Subjective:    Patient ID: Wesley [name], male    DOB: June 19, 1941, 70 y.o.   MRN: [id number]  HPI  70/M, remote smoker ,quit '85  with mild COPD, mod hiatal hernia & recurrent LLL infiltrate since march 2011.   This has happened in feb'11 , then again in aug'11, nov'11 &  8/12 CT chest on 10/23/09 showed ill defined nodular opacities in LLL & LUL with small effusion  CT angio in dec '09 also shows BLL opacities with some chronicity s/o underlying fibrosis  Spirometry showed midl airway obstruction with FEV1 69%  has been on advair & spiriva x many years.   Initital OV - 04/08/10  Swallowing evaln showed zenker's diverticulum, this was endoscopically repaired ? from pt's description by an ENT MD , he does not remember name. He has been resistant to my repeated recommendations for GI evaluation  Acute Visit 03/16/11 -- Hx mild AFL, COPD, treated for LLL PNA as above in 2011. Has been maintained on Spiriva + Advair. developed N/V, low grade fever, evolved into L back pain with cough prod of dark green mucous, some bloody streaks. Breathing worse over last several days  03/23/2011 1 week follow-up. Pt states his SOB has improved, but not gone and he still has a productive cough with clear phlegm.  Pt took last Avelox this morning. No fevers CXR  Improved LLL infiltrate but not completely gone >>no changes   07/23/2011 Acute OV  Complains of prod cough with dark green mucus, left back pain, increased SOB, wheezing x 4 days.  Worried he has PNA . CXR today shows chronic changes no acute process.  No hemoptysis or fever  No otc used.   Had CT chest 05/07/11 that showed interstitial densities in LUL and LLL -slightly progressed.   Review of Systems  Constitutional:   No  weight loss, night sweats,  Fevers, chills,  +fatigue, or  lassitude.  HEENT:   No headaches,  Difficulty swallowing,  Tooth/dental problems, or  Sore throat,                No sneezing, itching, ear ache,  +nasal  congestion, post nasal drip,   CV:  No chest pain,  Orthopnea, PND, swelling in lower extremities, anasarca, dizziness, palpitations, syncope.   GI  No heartburn, indigestion, abdominal pain, nausea, vomiting, diarrhea, change in bowel habits, loss of appetite, bloody stools.   Resp:  No coughing up of blood.     No chest wall deformity  Skin: no rash or lesions.  GU: no dysuria, change in color of urine, no urgency or frequency.  No flank pain, no hematuria   MS:  No joint pain or swelling.  No decreased range of motion.  No back pain.  Psych:  No change in mood or affect. No depression or anxiety.  No memory loss.        Objective:   Physical Exam  GEN: A/Ox3; pleasant , NAD elderly   HEENT:  [place]/AT,  EACs-clear, TMs-wnl, NOSE-clear, THROAT-clear, no lesions, no postnasal drip or exudate noted.   NECK:  Supple w/ fair ROM; no JVD; normal carotid impulses w/o bruits; no thyromegaly or nodules palpated; no lymphadenopathy.  RESP  Coarse BS  no accessory muscle use, no dullness to percussion  CARD:  RRR, no m/r/g  , no peripheral edema, pulses intact, no cyanosis or clubbing.  GI:   Soft & nt; nml bowel sounds; no organomegaly or masses detected.  Musco: Warm bil, no  deformities or joint swelling noted.   Neuro: alert, no focal deficits noted.    Skin: Warm, no lesions or rashes         Assessment & Plan:

## 2011-07-23 NOTE — Assessment & Plan Note (Signed)
No acute changes on cXR today  follow up Dr. Vassie Loll

## 2011-07-23 NOTE — Patient Instructions (Signed)
Doxycycline 100mg  Twice daily  For 7 days  Mucinex DM Twice daily  As needed  Cough/congestion  Prednisone taper over next week.  Please contact office for sooner follow up if symptoms do not improve or worsen or seek emergency care  follow up Dr. Vassie Loll  In 4-6 weeks and As needed

## 2011-07-23 NOTE — Assessment & Plan Note (Signed)
Flare   Plan;  Doxycycline 100mg  Twice daily  For 7 days  Mucinex DM Twice daily  As needed  Cough/congestion  Prednisone taper over next week.  Please contact office for sooner follow up if symptoms do not improve or worsen or seek emergency care  follow up Dr. Vassie Loll  In 4-6 weeks and As needed

## 2011-08-20 ENCOUNTER — Ambulatory Visit (INDEPENDENT_AMBULATORY_CARE_PROVIDER_SITE_OTHER): Payer: Medicare Other | Admitting: Internal Medicine

## 2011-08-20 ENCOUNTER — Encounter: Payer: Self-pay | Admitting: Internal Medicine

## 2011-08-20 VITALS — BP 112/70 | HR 84 | Ht 66.0 in | Wt 188.0 lb

## 2011-08-20 DIAGNOSIS — K219 Gastro-esophageal reflux disease without esophagitis: Secondary | ICD-10-CM

## 2011-08-20 DIAGNOSIS — K225 Diverticulum of esophagus, acquired: Secondary | ICD-10-CM | POA: Insufficient documentation

## 2011-08-20 DIAGNOSIS — R195 Other fecal abnormalities: Secondary | ICD-10-CM

## 2011-08-20 MED ORDER — PEG-KCL-NACL-NASULF-NA ASC-C 100 G PO SOLR: 1.0000 | Freq: Once | ORAL | Status: DC

## 2011-08-20 NOTE — Patient Instructions (Addendum)
You have been scheduled for an Endoscopy/Colonoscopy with separate instructions given. Stop the Iron medication Your prep kit has been sent to your pharmacy for you to pick up.

## 2011-08-20 NOTE — Progress Notes (Signed)
Subjective:    Patient ID: Wesley [name], male    DOB: 1940/10/14, 71 y.o.   MRN: [id number]  HPI The patient is a pleasant elderly man who is sent here because of a Hemoccult-positive stool. He had an immune fecal occult blood tests performed in November and it returned positive. He denies any overt bleeding or bowel habit change her abdominal pain. He does have a long history of acid reflux problems controlled in general using PPI, and he is currently on Prilosec 20 mg daily with successful control of symptoms. He does not have dysphagia. He does have a known history of a Zenker's diverticulum status post prior repair. There is some question as to whether or not this is contributing to recurrent pneumonia problems. He also has a known moderate hiatal hernia from CT scanning in the past. He has never had an upper endoscopy. He says he's had acid reflux problems for 20 years or more. He is currently on an iron supplement, he is not anemic, he says he was started on this several years ago. It looks like he had a hemoglobin of 9 at that time. No Known Allergies Outpatient Prescriptions Prior to Visit  Medication Sig Dispense Refill  . aspirin 325 MG tablet Take 325 mg by mouth daily.        Marland Kitchen atorvastatin (LIPITOR) 20 MG tablet Take 20 mg by mouth daily.        . Calcium 600-200 MG-UNIT per tablet Take 1 tablet by mouth 2 (two) times daily.        Marland Kitchen diltiazem (CARDIZEM CD) 240 MG 24 hr capsule Take 240 mg by mouth daily.        Marland Kitchen doxazosin (CARDURA) 2 MG tablet Take 2 mg by mouth at bedtime.        . Ferrous Gluconate (IRON) 246 (28 FE) MG TABS Once daily       . Fluticasone-Salmeterol (ADVAIR DISKUS) 250-50 MCG/DOSE AEPB Inhale 1 puff into the lungs every 12 (twelve) hours.        . furosemide (LASIX) 20 MG tablet Take 20 mg by mouth daily.        . montelukast (SINGULAIR) 10 MG tablet Take 10 mg by mouth at bedtime.        Marland Kitchen omeprazole (PRILOSEC) 20 MG capsule Take 20 mg by mouth daily.        .  risedronate (ACTONEL) 150 MG tablet Take 150 mg by mouth every 30 (thirty) days. with water on empty stomach, nothing by mouth or lie down for next 30 minutes.       Marland Kitchen tiotropium (SPIRIVA) 18 MCG inhalation capsule Place 18 mcg into inhaler and inhale daily.        . clopidogrel (PLAVIX) 75 MG tablet Take 75 mg by mouth daily.        . predniSONE (DELTASONE) 10 MG tablet 4 tabs for 2 days, then 3 tabs for 2 days, 2 tabs for 2 days, then 1 tab for 2 days, then stop  20 tablet  0   Past Medical History  Diagnosis Date  . Heart attack   . Hypertension   . Emphysema   . Osteoarthritis   . COPD (chronic obstructive pulmonary disease)   . Heart murmur   . CAD (coronary artery disease)   . GERD (gastroesophageal reflux disease)   . DDD (degenerative disc disease)   . Gout    Past Surgical History  Procedure Date  . Cholecystectomy   .  Knee surgery 1959    left knee  . Finger surgery 1996    right index finger  . Zenker's diverticulectomy Jan 10, 2005  . Coronary angioplasty with stent placement 11/11/2006    x 2  . Total hip arthroplasty 07/2008    left hip  . Shoulder surgery 1982    right shoulder  . Hernia repair 1975   History   Social History  . Marital Status: Single    Spouse Name: N/A    Number of Children: 2  . Years of Education: N/A   Occupational History  . security     retired   Social History Main Topics  . Smoking status: Former Smoker -- 2.5 packs/day for 28 years    Quit date: 08/17/1983  . Smokeless tobacco: Never Used  . Alcohol Use: Yes     2 beers daily  . Drug Use: No  . Sexually Active: None   Other Topics Concern  . None   Social History Narrative  . None   Family History  Problem Relation Age of Onset  . Heart attack Father   . Heart attack Mother   . Diabetes Father         Review of Systems Positive for back pain, cough, dyspnea, hearing difficulty. All other review of systems negative or as about    Objective:   Physical  Exam General:  Mildly chronically ill but no acute distress Eyes:  anicteric. ENT:   Mouth and posterior pharynx free of lesions.  Neck:   supple w/o thyromegaly or mass.  Lungs: Clear to auscultation bilaterally. Heart:  S1S2, positive S4 no rubs, murmurs Abdomen:  soft, non-tender, no hepatosplenomegaly, or mass and BS+. He is obese, there is a small incisional hernia from   laparoscopic cholecystectomy scar in the right epigastric area Rectal: Deferred until colonoscopy Lymph:  no cervical or supraclavicular adenopathy. Extremities:   no edema Neuro:  A&O x 3.  Psych:  appropriate mood and affect.   Data Reviewed: Office notes, labs from primary care office. CBC was normal with MCV 98.4 on November 5 12. Hemoglobin 15.9 white was normal white count 7.8.        Assessment & Plan:   1. Heme + stool   2. GERD (gastroesophageal reflux disease)   3. Zenker's (hypopharyngeal) diverticulum     His heme positive stools of unclear etiology. He has had 2 colonoscopies in the past, one he had some sort of bleeding problem and the other at think was routine. As best he knows no significant pathology ever found. Since immune based passed this represents occult bleeding from the colon and no where else in the GI tract. Because of this a colonoscopy is recommended, I've explained the risks benefits and indications and he understands and agrees to proceed.  He also has chronic GERD issues a Zenker's diverticulum by report and a hiatal hernia. I think it reasonable to pursue an endoscopy to evaluate this area better understand things. It would be appropriate to make sure his reflux is under control, he could need a change in therapy. This could potentially reduce problems with suspected aspiration pneumonia. He has been told to pursue further evaluation and treatment of Zenker's diverticulum, I don't see a recent barium swallow, however he is to return to his ENT specialist that did his initial  repair. He has simply just not done that yet been intends to do so.

## 2011-08-23 ENCOUNTER — Encounter: Payer: Self-pay | Admitting: Internal Medicine

## 2011-09-03 ENCOUNTER — Ambulatory Visit: Payer: Medicare Other | Admitting: Pulmonary Disease

## 2011-09-14 ENCOUNTER — Ambulatory Visit (INDEPENDENT_AMBULATORY_CARE_PROVIDER_SITE_OTHER): Payer: Medicare Other | Admitting: Pulmonary Disease

## 2011-09-14 ENCOUNTER — Encounter: Payer: Self-pay | Admitting: Pulmonary Disease

## 2011-09-14 DIAGNOSIS — J438 Other emphysema: Secondary | ICD-10-CM

## 2011-09-14 DIAGNOSIS — J189 Pneumonia, unspecified organism: Secondary | ICD-10-CM

## 2011-09-14 NOTE — Patient Instructions (Signed)
You have mild COPD You get repeated pneumonias due to aspiration We will await dr gessner's evaluation

## 2011-09-14 NOTE — Progress Notes (Signed)
  Subjective:    Patient ID: Wesley [name], male    DOB: 15-Sep-1940, 71 y.o.   MRN: [id number]  HPI 70/M, remote smoker ,quit '85 with mild COPD, mod hiatal hernia & recurrent LLL infiltrate, presumed aspiration. LLL scarring has been stable on CT.  This has happened in feb'11 , then again in aug'11, nov'11, 8/12 & dec '12 CT chest on 10/23/09 showed ill defined nodular opacities in LLL & LUL with small effusion  CT angio in dec '09 also shows BLL opacities with some chronicity s/o underlying fibrosis  Spirometry showed midl airway obstruction with FEV1 69%  has been on advair & spiriva x many years.  Initital OV - 04/08/10  Swallowing evaln showed zenker's diverticulum, this was endoscopically repaired ? from pt's description by an ENT MD , he does not remember name.  CT chest 05/07/11  showed interstitial densities in LUL and LLL -slightly progressed.  09/14/2011 Scarring in LLL is better appreciated on CT than on CXR stable from 2011 to 2012 He is now back to baseline. c/o DOE, wheezing on exertion, productive cough with green mucus with foul odor has subsided. He is to undergo EGD/ cscopy in a couple of weeks     Review of Systems Patient denies significant dyspnea,cough, hemoptysis,  chest pain, palpitations, pedal edema, orthopnea, paroxysmal nocturnal dyspnea, lightheadedness, nausea, vomiting, abdominal or  leg pains      Objective:   Physical Exam  Gen. Pleasant, well-nourished, in no distress ENT - no lesions, no post nasal drip Neck: No JVD, no thyromegaly, no carotid bruits Lungs: no use of accessory muscles, no dullness to percussion, clear without rales or rhonchi  Cardiovascular: Rhythm regular, heart sounds  normal, no murmurs or gallops, no peripheral edema Musculoskeletal: No deformities, no cyanosis or clubbing        Assessment & Plan:

## 2011-09-15 NOTE — Assessment & Plan Note (Signed)
Recurrent LLL Related to aspiration from zenker's diverticulum/ hiatal hernia CT chest 05/07/11 showed interstitial densities in LUL and LLL -slightly progressed. Await endocsopy findings - If needed, he is agreeable for repair of diverticulum - defer to GI

## 2011-09-15 NOTE — Assessment & Plan Note (Signed)
Mild - FEV 1 69% No contra indication to conscious sedation Ct advair & spiriva

## 2011-09-23 ENCOUNTER — Ambulatory Visit (AMBULATORY_SURGERY_CENTER): Payer: Medicare Other | Admitting: Internal Medicine

## 2011-09-23 ENCOUNTER — Encounter: Payer: Self-pay | Admitting: Internal Medicine

## 2011-09-23 DIAGNOSIS — K449 Diaphragmatic hernia without obstruction or gangrene: Secondary | ICD-10-CM

## 2011-09-23 DIAGNOSIS — D131 Benign neoplasm of stomach: Secondary | ICD-10-CM

## 2011-09-23 DIAGNOSIS — K5730 Diverticulosis of large intestine without perforation or abscess without bleeding: Secondary | ICD-10-CM

## 2011-09-23 DIAGNOSIS — D126 Benign neoplasm of colon, unspecified: Secondary | ICD-10-CM

## 2011-09-23 DIAGNOSIS — K2270 Barrett's esophagus without dysplasia: Secondary | ICD-10-CM

## 2011-09-23 DIAGNOSIS — K317 Polyp of stomach and duodenum: Secondary | ICD-10-CM

## 2011-09-23 DIAGNOSIS — K219 Gastro-esophageal reflux disease without esophagitis: Secondary | ICD-10-CM

## 2011-09-23 DIAGNOSIS — R195 Other fecal abnormalities: Secondary | ICD-10-CM

## 2011-09-23 DIAGNOSIS — K648 Other hemorrhoids: Secondary | ICD-10-CM

## 2011-09-23 MED ORDER — SODIUM CHLORIDE 0.9 % IV SOLN: 500.0000 mL | INTRAVENOUS | Status: DC

## 2011-09-23 NOTE — Telephone Encounter (Signed)
Pt returned call. [name]  

## 2011-09-23 NOTE — Op Note (Signed)
[facility] [address] [place], [place]  [address]  COLONOSCOPY PROCEDURE REPORT  PATIENT:  Wesley [name], Wesley [name]  MR#:  [id number] BIRTHDATE:  1941/06/17, 70 yrs. old  GENDER:  male ENDOSCOPIST:  Iva Boop, MD, [facility] REF. BY:  Rudi Heap, M.D. PROCEDURE DATE:  09/23/2011 PROCEDURE:  Colonoscopy with snare polypectomy ASA CLASS:  Class III INDICATIONS:  heme positive stool iFOBT + MEDICATIONS:   There was residual sedation effect present from prior procedure., These medications were titrated to patient response per physician's verbal order, Fentanyl 25 mcg IV, Versed 2 mg IV  DESCRIPTION OF PROCEDURE:   After the risks benefits and alternatives of the procedure were thoroughly explained, informed consent was obtained.  Digital rectal exam was performed and revealed no abnormalities and normal prostate.   The LB [id number]-[id number] endoscope was introduced through the anus and advanced to the cecum, which was identified by both the appendix and ileocecal valve, without limitations.  The quality of the prep was adequate, using MoviPrep.  The instrument was then slowly withdrawn as the colon was fully examined. <<PROCEDUREIMAGES>>  FINDINGS:  A diminutive polyp was found in the descending colon. Polyp was snared without cautery. Retrieval was successful. Moderate diverticulosis was found in the sigmoid colon.  This was otherwise a normal examination of the colon.   Retroflexed views in the rectum revealed internal hemorrhoids.    The time to cecum = 2:09 minutes. The scope was then withdrawn in 11:48 minutes from the cecum and the procedure completed. COMPLICATIONS:  None ENDOSCOPIC IMPRESSION: 1) Diminutive polyp in the descending colon - removed 2) Moderate diverticulosis in the sigmoid colon 3) Internal hemorrhoids - probable cause of heme + stool 4) Otherwise normal examination - adequate prep  REPEAT EXAM:  may not need routine recall, await pathology  review - does not need routine hemoccults anytime soon  Iva Boop, MD, [facility]  CC:  Rudi Heap, MD and The Patient  n. eSIGNED:   Iva Boop at 09/23/2011 11:42 AM  Corena Herter, [phone]

## 2011-09-23 NOTE — Patient Instructions (Addendum)
Please read the procedure reports and information provided. I will notify you regarding results and plans. Iva Boop, MD, [facility] FOLLOW DISCHARGE INSTRUCTIONS [facility] AND GREEN SHEETS).Marland Kitchen

## 2011-09-23 NOTE — Progress Notes (Signed)
Patient did not have preoperative order for IV antibiotic SSI prophylaxis. (G8918)  Patient did not experience any of the following events: a burn prior to discharge; a fall within the facility; wrong site/side/patient/procedure/implant event; or a hospital transfer or hospital admission upon discharge from the facility. (G8907)  

## 2011-09-23 NOTE — Op Note (Signed)
[facility] [address] [place], [place]  [address]  ENDOSCOPY PROCEDURE REPORT  [name]:  Wesley [name], Wesley [name]  MR#:  [id number] BIRTHDATE:  October 31, 1940, 70 yrs. old  GENDER:  male  ENDOSCOPIST:  Iva Boop, MD, [facility] Referred by:  PROCEDURE DATE:  09/23/2011 PROCEDURE:  EGD with biopsy, 557[address] ASA CLASS:  Class III INDICATIONS:  GERD, dysphagia  MEDICATIONS:   These medications were titrated to [name] response per physician's verbal order, Fentanyl 75 mcg IV, Versed 4 mg IV TOPICAL ANESTHETIC:  Cetacaine Spray  DESCRIPTION OF PROCEDURE:   After Wesley risks benefits and alternatives of Wesley procedure were thoroughly explained, informed consent was obtained.  Wesley LB GIF-H180 [id number] endoscope was introduced through Wesley mouth and advanced to Wesley second portion of Wesley duodenum, without limitations.  Wesley instrument was slowly withdrawn as Wesley mucosa was fully examined. <<PROCEDUREIMAGES>>  There were columnar-type mucosal changes in Wesley distal esophagus, that could represent Barrett's esophagus. in Wesley distal esophagus. 28-33 cm Four quadrant biopsies were taken every 2 cm. through Wesley length of Wesley barretts.  A hiatal hernia was found. Large, difficult to measure.  A sessile polyp was found in Wesley body of Wesley stomach. soft and fleshy, 10-12 mm. Multiple biopsies were obtained and sent to pathology.  Otherwise Wesley examination was normal. Known Zenker's not seen.    Retroflexed views revealed findings as previously described.    Wesley scope was then withdrawn from Wesley [name] and Wesley procedure completed.  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  Iva Boop, MD, [facility]  CC:  Rudi Heap, MD and Wesley [name]  n. eSIGNED:   Iva Boop at 09/23/2011 11:36 AM  Corena Herter, [phone]

## 2011-09-24 ENCOUNTER — Telehealth: Payer: Self-pay | Admitting: *Deleted

## 2011-09-24 NOTE — Telephone Encounter (Signed)
l  Follow up Call-  Call back number 09/23/2011  Post procedure Call Back phone  # [phone]     Patient questions:  Do you have a fever, pain , or abdominal swelling? no Pain Score  0 *  Have you tolerated food without any problems? yes  Have you been able to return to your normal activities? yes  Do you have any questions about your discharge instructions: Diet   no Medications  no Follow up visit  no  Do you have questions or concerns about your Care? no  Actions: * If pain score is 4 or above: No action needed, pain <4.  Pt states, "I had a couple of teaspoons of blood in my stool last night, but no more since then."

## 2011-09-25 NOTE — Telephone Encounter (Signed)
Why is this tagged to messages from 10/12?

## 2011-09-27 NOTE — Telephone Encounter (Signed)
Unsure why this is the case.  LMOM for pt TCB to get more information.

## 2011-09-28 ENCOUNTER — Encounter: Payer: Self-pay | Admitting: Internal Medicine

## 2011-09-28 DIAGNOSIS — Z860100 Personal history of colon polyps, unspecified: Secondary | ICD-10-CM | POA: Insufficient documentation

## 2011-09-28 DIAGNOSIS — K2270 Barrett's esophagus without dysplasia: Secondary | ICD-10-CM | POA: Insufficient documentation

## 2011-09-28 NOTE — Progress Notes (Signed)
Quick Note:  Barrett's = recall egd 09/2014 Colonoscopy recall for adenoma 09/2016 ______

## 2012-02-14 DIAGNOSIS — M79609 Pain in unspecified limb: Secondary | ICD-10-CM

## 2012-11-10 ENCOUNTER — Other Ambulatory Visit: Payer: Self-pay

## 2012-11-13 ENCOUNTER — Other Ambulatory Visit: Payer: Self-pay

## 2012-11-13 MED ORDER — TIOTROPIUM BROMIDE MONOHYDRATE 18 MCG IN CAPS: 18.0000 ug | ORAL_CAPSULE | Freq: Every day | RESPIRATORY_TRACT | Status: DC

## 2012-11-13 NOTE — Telephone Encounter (Signed)
Hasn't been seen since 03/20/12

## 2012-12-11 ENCOUNTER — Encounter: Payer: Self-pay | Admitting: Nurse Practitioner

## 2012-12-11 ENCOUNTER — Ambulatory Visit (INDEPENDENT_AMBULATORY_CARE_PROVIDER_SITE_OTHER): Payer: Medicare Other | Admitting: Nurse Practitioner

## 2012-12-11 VITALS — BP 124/66 | HR 67 | Temp 97.8°F | Ht 66.0 in | Wt 183.0 lb

## 2012-12-11 DIAGNOSIS — I25810 Atherosclerosis of coronary artery bypass graft(s) without angina pectoris: Secondary | ICD-10-CM

## 2012-12-11 DIAGNOSIS — E876 Hypokalemia: Secondary | ICD-10-CM | POA: Insufficient documentation

## 2012-12-11 DIAGNOSIS — I10 Essential (primary) hypertension: Secondary | ICD-10-CM

## 2012-12-11 DIAGNOSIS — M109 Gout, unspecified: Secondary | ICD-10-CM

## 2012-12-11 DIAGNOSIS — E785 Hyperlipidemia, unspecified: Secondary | ICD-10-CM | POA: Insufficient documentation

## 2012-12-11 DIAGNOSIS — R609 Edema, unspecified: Secondary | ICD-10-CM

## 2012-12-11 DIAGNOSIS — Z139 Encounter for screening, unspecified: Secondary | ICD-10-CM

## 2012-12-11 MED ORDER — POTASSIUM CHLORIDE ER 10 MEQ PO TBCR: 10.0000 meq | EXTENDED_RELEASE_TABLET | Freq: Two times a day (BID) | ORAL | Status: DC

## 2012-12-11 MED ORDER — FLUTICASONE-SALMETEROL 250-50 MCG/DOSE IN AEPB: 1.0000 | INHALATION_SPRAY | Freq: Two times a day (BID) | RESPIRATORY_TRACT | Status: DC

## 2012-12-11 NOTE — Progress Notes (Signed)
Subjective:    Patient ID: Wesley [name], male    DOB: 1941-03-22, 72 y.o.   MRN: [id number]  Hypertension This is a chronic problem. The current episode started more than 1 year ago. The problem is unchanged. The problem is controlled. Pertinent negatives include no blurred vision, chest pain, headaches, malaise/fatigue, neck pain, palpitations, peripheral edema, shortness of breath or sweats. There are no associated agents to hypertension. Risk factors for coronary artery disease include dyslipidemia, obesity and male gender. The current treatment provides moderate improvement. Compliance problems include diet and exercise.  Hypertensive end-organ damage includes CAD/MI.  Hyperlipidemia This is a chronic problem. The current episode started more than 1 year ago. The problem is uncontrolled. Recent lipid tests were reviewed and are high. Exacerbating diseases include obesity. He has no history of diabetes. There are no known factors aggravating his hyperlipidemia. Pertinent negatives include no chest pain, leg pain, myalgias or shortness of breath. Current antihyperlipidemic treatment includes statins (patient has been out of meds for 1 month). The current treatment provides moderate improvement of lipids. Compliance problems include adherence to exercise and adherence to diet.  Risk factors for coronary artery disease include hypertension, male sex and obesity.  emphysema On spirivia and advair. Having some SOB at night but otherwise doing well. Peripheral edema On lasix which works well . If runs out swelling reoccurs. CAD/stent placement  Cardizem - no C/O chest pain. Some DOE. Sees cardiologist every 6 months. GERD Omeprazole working well to keep symptoms under control Hypokalemia Kdur daily- NO c/o lower extremity cramping. Osteoporosis ACtonel weekly - No C/O LBP Review of Systems  Constitutional: Negative for malaise/fatigue.  HENT: Negative for neck pain.   Eyes: Negative for  blurred vision.  Respiratory: Negative for shortness of breath.   Cardiovascular: Negative for chest pain and palpitations.  Musculoskeletal: Negative for myalgias.  Neurological: Negative for headaches.  All other systems reviewed and are negative.       Objective:   Physical Exam  Constitutional: He is oriented to person, place, and time. He appears well-developed and well-nourished.  HENT:  Head: Normocephalic.  Right Ear: External ear normal.  Left Ear: External ear normal.  Nose: Nose normal.  Mouth/Throat: Oropharynx is clear and moist.  Eyes: EOM are normal. Pupils are equal, round, and reactive to light.  Neck: Normal range of motion. Neck supple. No JVD present. Carotid bruit is not present. No thyromegaly present.  Cardiovascular: Normal rate, regular rhythm, normal heart sounds and intact distal pulses.   No murmur heard. Pulmonary/Chest: Effort normal. He has wheezes (expiratory) in the right middle field, the right lower field, the left middle field and the left lower field. He has no rales.  Abdominal: Soft. Bowel sounds are normal.  Musculoskeletal: Normal range of motion.  Neurological: He is alert and oriented to person, place, and time.  Skin: Skin is warm and dry.  Psychiatric: He has a normal mood and affect. His behavior is normal. Judgment and thought content normal.   BP 124/66  Pulse 67  Temp(Src) 97.8 F (36.6 C) (Oral)  Ht 5\' 6"  (1.676 m)  Wt 183 lb (83.008 kg)  BMI 29.55 kg/m2        Assessment & Plan:  Gout  Hyperlipidemia - Plan: NMR Lipoprofile with Lipids  Peripheral edema  CAD (coronary artery disease) of artery bypass graft  Hypokalemia  Essential hypertension, benign - Plan: COMPLETE METABOLIC PANEL WITH GFR  Screening - Plan: PSA  Continue all mens Labs pending Diet  and exercise encouraged F/U in 3 months Health maintenance reviewed  Mary-Margaret Daphine Deutscher, FNP

## 2012-12-11 NOTE — Patient Instructions (Signed)
Health Maintenance, Males A healthy lifestyle and preventative care can promote health and wellness.  Maintain regular health, dental, and eye exams.  Eat a healthy diet. Foods like vegetables, fruits, whole grains, low-fat dairy products, and lean protein foods contain the nutrients you need without too many calories. Decrease your intake of foods high in solid fats, added sugars, and salt. Get information about a proper diet from your caregiver, if necessary.  Regular physical exercise is one of the most important things you can do for your health. Most adults should get at least 150 minutes of moderate-intensity exercise (any activity that increases your heart rate and causes you to sweat) each week. In addition, most adults need muscle-strengthening exercises on 2 or more days a week.   Maintain a healthy weight. The body mass index (BMI) is a screening tool to identify possible weight problems. It provides an estimate of body fat based on height and weight. Your caregiver can help determine your BMI, and can help you achieve or maintain a healthy weight. For adults 20 years and older:  A BMI below 18.5 is considered underweight.  A BMI of 18.5 to 24.9 is normal.  A BMI of 25 to 29.9 is considered overweight.  A BMI of 30 and above is considered obese.  Maintain normal blood lipids and cholesterol by exercising and minimizing your intake of saturated fat. Eat a balanced diet with plenty of fruits and vegetables. Blood tests for lipids and cholesterol should begin at age 20 and be repeated every 5 years. If your lipid or cholesterol levels are high, you are over 50, or you are a high risk for heart disease, you may need your cholesterol levels checked more frequently.Ongoing high lipid and cholesterol levels should be treated with medicines, if diet and exercise are not effective.  If you smoke, find out from your caregiver how to quit. If you do not use tobacco, do not start.  If you  choose to drink alcohol, do not exceed 2 drinks per day. One drink is considered to be 12 ounces (355 mL) of beer, 5 ounces (148 mL) of wine, or 1.5 ounces (44 mL) of liquor.  Avoid use of street drugs. Do not share needles with anyone. Ask for help if you need support or instructions about stopping the use of drugs.  High blood pressure causes heart disease and increases the risk of stroke. Blood pressure should be checked at least every 1 to 2 years. Ongoing high blood pressure should be treated with medicines if weight loss and exercise are not effective.  If you are 45 to 73 years old, ask your caregiver if you should take aspirin to prevent heart disease.  Diabetes screening involves taking a blood sample to check your fasting blood sugar level. This should be done once every 3 years, after age 45, if you are within normal weight and without risk factors for diabetes. Testing should be considered at a younger age or be carried out more frequently if you are overweight and have at least 1 risk factor for diabetes.  Colorectal cancer can be detected and often prevented. Most routine colorectal cancer screening begins at the age of 50 and continues through age 75. However, your caregiver may recommend screening at an earlier age if you have risk factors for colon cancer. On a yearly basis, your caregiver may provide home test kits to check for hidden blood in the stool. Use of a small camera at the end of a tube,   to directly examine the colon (sigmoidoscopy or colonoscopy), can detect the earliest forms of colorectal cancer. Talk to your caregiver about this at age 50, when routine screening begins. Direct examination of the colon should be repeated every 5 to 10 years through age 75, unless early forms of pre-cancerous polyps or small growths are found.  Hepatitis C blood testing is recommended for all people born from 1945 through 1965 and any individual with known risks for hepatitis C.  Healthy  men should no longer receive prostate-specific antigen (PSA) blood tests as part of routine cancer screening. Consult with your caregiver about prostate cancer screening.  Testicular cancer screening is not recommended for adolescents or adult males who have no symptoms. Screening includes self-exam, caregiver exam, and other screening tests. Consult with your caregiver about any symptoms you have or any concerns you have about testicular cancer.  Practice safe sex. Use condoms and avoid high-risk sexual practices to reduce the spread of sexually transmitted infections (STIs).  Use sunscreen with a sun protection factor (SPF) of 30 or greater. Apply sunscreen liberally and repeatedly throughout the day. You should seek shade when your shadow is shorter than you. Protect yourself by wearing long sleeves, pants, a wide-brimmed hat, and sunglasses year round, whenever you are outdoors.  Notify your caregiver of new moles or changes in moles, especially if there is a change in shape or color. Also notify your caregiver if a mole is larger than the size of a pencil eraser.  A one-time screening for abdominal aortic aneurysm (AAA) and surgical repair of large AAAs by sound wave imaging (ultrasonography) is recommended for ages 65 to 75 years who are current or former smokers.  Stay current with your immunizations. Document Released: 01/29/2008 Document Revised: 10/25/2011 Document Reviewed: 12/28/2010 [employer] Patient Information 2013 [employer].  

## 2012-12-12 LAB — COMPLETE METABOLIC PANEL WITH GFR
AST: 17 U/L (ref 0–37)
Albumin: 3.9 g/dL (ref 3.5–5.2)
BUN: 4 mg/dL — ABNORMAL LOW (ref 6–23)
Calcium: 9 mg/dL (ref 8.4–10.5)
Chloride: 96 mEq/L (ref 96–112)
Potassium: 4 mEq/L (ref 3.5–5.3)
Total Protein: 6.4 g/dL (ref 6.0–8.3)

## 2012-12-12 LAB — NMR LIPOPROFILE WITH LIPIDS
Cholesterol, Total: 161 mg/dL (ref <200)
HDL Particle Number: 40.5 umol/L (ref >=30.5)
LDL Particle Number: 372 nmol/L (ref <1000)
Large HDL-P: 13.3 umol/L (ref >=4.8)
Large VLDL-P: 5.5 nmol/L — ABNORMAL HIGH (ref <=2.7)
Small LDL Particle Number: <90 nmol/L (ref <=527)
VLDL Size: 55.5 nm — ABNORMAL HIGH (ref <=46.6)

## 2012-12-18 ENCOUNTER — Other Ambulatory Visit: Payer: Self-pay | Admitting: *Deleted

## 2012-12-18 MED ORDER — ATORVASTATIN CALCIUM 20 MG PO TABS: 20.0000 mg | ORAL_TABLET | Freq: Every day | ORAL | Status: DC

## 2012-12-25 ENCOUNTER — Encounter: Payer: Self-pay | Admitting: *Deleted

## 2012-12-27 ENCOUNTER — Telehealth: Payer: Self-pay | Admitting: Nurse Practitioner

## 2012-12-28 NOTE — Telephone Encounter (Signed)
Letter was mailed asking pt to come back to have an A 1C done. Phone gives fast busy signal constantly.

## 2013-01-11 ENCOUNTER — Other Ambulatory Visit: Payer: Self-pay | Admitting: *Deleted

## 2013-01-11 MED ORDER — FUROSEMIDE 20 MG PO TABS: 20.0000 mg | ORAL_TABLET | Freq: Every day | ORAL | Status: DC

## 2013-01-11 MED ORDER — OMEPRAZOLE 20 MG PO CPDR: DELAYED_RELEASE_CAPSULE | ORAL | Status: DC

## 2013-02-20 ENCOUNTER — Other Ambulatory Visit: Payer: Self-pay | Admitting: *Deleted

## 2013-02-20 MED ORDER — RISEDRONATE SODIUM 150 MG PO TABS: 150.0000 mg | ORAL_TABLET | ORAL | Status: DC

## 2013-02-23 ENCOUNTER — Other Ambulatory Visit: Payer: Self-pay

## 2013-02-23 MED ORDER — DOXAZOSIN MESYLATE 2 MG PO TABS: 2.0000 mg | ORAL_TABLET | Freq: Every day | ORAL | Status: DC

## 2013-03-14 ENCOUNTER — Ambulatory Visit (INDEPENDENT_AMBULATORY_CARE_PROVIDER_SITE_OTHER): Payer: Medicare Other | Admitting: Nurse Practitioner

## 2013-03-14 ENCOUNTER — Encounter: Payer: Self-pay | Admitting: Nurse Practitioner

## 2013-03-14 VITALS — BP 147/74 | HR 62 | Temp 97.4°F | Ht 66.0 in | Wt 180.0 lb

## 2013-03-14 DIAGNOSIS — K219 Gastro-esophageal reflux disease without esophagitis: Secondary | ICD-10-CM

## 2013-03-14 DIAGNOSIS — E785 Hyperlipidemia, unspecified: Secondary | ICD-10-CM

## 2013-03-14 DIAGNOSIS — M810 Age-related osteoporosis without current pathological fracture: Secondary | ICD-10-CM

## 2013-03-14 DIAGNOSIS — R609 Edema, unspecified: Secondary | ICD-10-CM

## 2013-03-14 DIAGNOSIS — M109 Gout, unspecified: Secondary | ICD-10-CM

## 2013-03-14 DIAGNOSIS — I10 Essential (primary) hypertension: Secondary | ICD-10-CM

## 2013-03-14 DIAGNOSIS — E876 Hypokalemia: Secondary | ICD-10-CM

## 2013-03-14 DIAGNOSIS — I25810 Atherosclerosis of coronary artery bypass graft(s) without angina pectoris: Secondary | ICD-10-CM

## 2013-03-14 DIAGNOSIS — J449 Chronic obstructive pulmonary disease, unspecified: Secondary | ICD-10-CM

## 2013-03-14 MED ORDER — TIOTROPIUM BROMIDE MONOHYDRATE 18 MCG IN CAPS: 18.0000 ug | ORAL_CAPSULE | Freq: Every day | RESPIRATORY_TRACT | Status: DC

## 2013-03-14 MED ORDER — ALLOPURINOL 100 MG PO TABS: 200.0000 mg | ORAL_TABLET | Freq: Every day | ORAL | Status: DC

## 2013-03-14 MED ORDER — OMEPRAZOLE 20 MG PO CPDR: DELAYED_RELEASE_CAPSULE | ORAL | Status: DC

## 2013-03-14 MED ORDER — FUROSEMIDE 20 MG PO TABS: 20.0000 mg | ORAL_TABLET | Freq: Every day | ORAL | Status: DC

## 2013-03-14 MED ORDER — RISEDRONATE SODIUM 150 MG PO TABS: 150.0000 mg | ORAL_TABLET | ORAL | Status: DC

## 2013-03-14 MED ORDER — DOXAZOSIN MESYLATE 2 MG PO TABS: 2.0000 mg | ORAL_TABLET | Freq: Every day | ORAL | Status: DC

## 2013-03-14 MED ORDER — FLUTICASONE-SALMETEROL 250-50 MCG/DOSE IN AEPB: 1.0000 | INHALATION_SPRAY | Freq: Two times a day (BID) | RESPIRATORY_TRACT | Status: DC

## 2013-03-14 MED ORDER — POTASSIUM CHLORIDE ER 10 MEQ PO TBCR: 10.0000 meq | EXTENDED_RELEASE_TABLET | Freq: Two times a day (BID) | ORAL | Status: DC

## 2013-03-14 MED ORDER — ATORVASTATIN CALCIUM 20 MG PO TABS: 20.0000 mg | ORAL_TABLET | Freq: Every day | ORAL | Status: DC

## 2013-03-14 MED ORDER — DILTIAZEM HCL ER COATED BEADS 240 MG PO CP24: 240.0000 mg | ORAL_CAPSULE | Freq: Every day | ORAL | Status: DC

## 2013-03-14 NOTE — Progress Notes (Signed)
Subjective:    Patient ID: Wesley [name], male    DOB: [birth year], 72 y.o.   MRN: [id number]  Hyperlipidemia This is a chronic problem. The current episode started more than 1 year ago. The problem is controlled. Recent lipid tests were reviewed and are low. He has no history of diabetes. Associated symptoms include shortness of breath (DOE). Pertinent negatives include no chest pain, leg pain or myalgias. Current antihyperlipidemic treatment includes statins. The current treatment provides significant improvement of lipids. Risk factors for coronary artery disease include dyslipidemia, hypertension, male sex and family history.  Hypertension This is a chronic problem. The current episode started more than 1 year ago. The problem has been gradually improving since onset. The problem is controlled. Associated symptoms include shortness of breath (DOE). Pertinent negatives include no blurred vision, chest pain, palpitations or peripheral edema. Risk factors for coronary artery disease include dyslipidemia, family history and male gender. Past treatments include calcium channel blockers. The current treatment provides moderate improvement. There is no history of heart failure or a thyroid problem. There is no history of sleep apnea.  Gastrophageal Reflux He complains of coughing. He reports no chest pain, no heartburn or no nausea. This is a chronic problem. The current episode started more than 1 year ago. The problem occurs rarely. The problem has been resolved. He has tried a PPI for the symptoms. The treatment provided significant relief.  COPD Pt takes Spiriva and Advair-Pt states this controls his breathing well. Hasn't needed albuterol rescue inhaler Osteoporosis Pt takes Actonel 150 mg- Works well. Pt does not know when his last dexascan was. States a least "a couple of years ago". Gout Allopurinol daily- no recent flare ups CAD/ stent placement Diltiazem and cardura daily- sees cardiologist  yearly- has appointment in November  Review of Systems  Eyes: Negative for blurred vision.  Respiratory: Positive for cough and shortness of breath (DOE).   Cardiovascular: Negative for chest pain and palpitations.  Gastrointestinal: Negative for heartburn and nausea.  Musculoskeletal: Negative for myalgias.  Neurological:       Pt c/o neuropathy in bilaterally in LE   All other systems reviewed and are negative.       Objective:   Physical Exam  Constitutional: He is oriented to person, place, and time. He appears well-developed and well-nourished.  HENT:  Head: Normocephalic.  Eyes: Pupils are equal, round, and reactive to light.  Neck: Normal range of motion. Neck supple. No thyromegaly present.  Cardiovascular: Normal rate, regular rhythm and intact distal pulses.   No murmur heard. Pulmonary/Chest: Effort normal. He has wheezes.  Inspiratory Wheezes with diminished breath sounds  Abdominal: Soft. Bowel sounds are normal. There is no tenderness. There is no rebound.  Musculoskeletal: Normal range of motion. He exhibits edema (trace amt in left ankle). He exhibits no tenderness.  Neurological: He is alert and oriented to person, place, and time.  Skin: Skin is warm and dry.  Psychiatric: He has a normal mood and affect. His behavior is normal. Judgment and thought content normal.    BP 147/74  Pulse 62  Temp(Src) 97.4 F (36.3 C) (Oral)  Ht 5\' 6"  (1.676 m)  Wt 180 lb (81.647 kg)  BMI 29.07 kg/m2       Assessment & Plan:   1. HYPERTENSION   2. Hyperlipidemia   3. Gout   4. Peripheral edema   5. CAD (coronary artery disease) of artery bypass graft   6. Hypokalemia   7. COPD (chronic obstructive  pulmonary disease)   8. Osteoporosis   9. GERD (gastroesophageal reflux disease)    Orders Placed This Encounter  Procedures  . COMPLETE METABOLIC PANEL WITH GFR  . NMR Lipoprofile with Lipids   Meds ordered this encounter  Medications  . tiotropium (SPIRIVA) 18  MCG inhalation capsule    Sig: Place 1 capsule (18 mcg total) into inhaler and inhale daily.    Dispense:  30 capsule    Refill:  5    Order Specific Question:  Supervising Provider    Answer:  Ernestina Penna [[id number]]  . risedronate (ACTONEL) 150 MG tablet    Sig: Take 1 tablet (150 mg total) by mouth every 30 (thirty) days. with water on empty stomach, nothing by mouth or lie down for next 30 minutes.    Dispense:  1 tablet    Refill:  5    Order Specific Question:  Supervising Provider    Answer:  Ernestina Penna [[id number]]  . potassium chloride (KLOR-CON 10) 10 MEQ tablet    Sig: Take 1 tablet (10 mEq total) by mouth 2 (two) times daily.    Dispense:  60 tablet    Refill:  5    Order Specific Question:  Supervising Provider    Answer:  Ernestina Penna [[id number]]  . omeprazole (PRILOSEC) 20 MG capsule    Sig: TAKE 2 CAPSULES BY MOUTH EVERY DAY    Dispense:  180 capsule    Refill:  5    Order Specific Question:  Supervising Provider    Answer:  Ernestina Penna [[id number]]  . Fluticasone-Salmeterol (ADVAIR DISKUS) 250-50 MCG/DOSE AEPB    Sig: Inhale 1 puff into the lungs every 12 (twelve) hours.    Dispense:  60 each    Refill:  5    Order Specific Question:  Supervising Provider    Answer:  Ernestina Penna [[id number]]  . furosemide (LASIX) 20 MG tablet    Sig: Take 1 tablet (20 mg total) by mouth daily.    Dispense:  30 tablet    Refill:  5    Order Specific Question:  Supervising Provider    Answer:  Ernestina Penna [[id number]]  . doxazosin (CARDURA) 2 MG tablet    Sig: Take 1 tablet (2 mg total) by mouth at bedtime.    Dispense:  30 tablet    Refill:  5    Order Specific Question:  Supervising Provider    Answer:  Ernestina Penna [[id number]]  . diltiazem (CARDIZEM CD) 240 MG 24 hr capsule    Sig: Take 1 capsule (240 mg total) by mouth daily.    Dispense:  30 capsule    Refill:  5    Order Specific Question:  Supervising Provider    Answer:  Ernestina Penna [[id number]]  . atorvastatin (LIPITOR) 20 MG  tablet    Sig: Take 1 tablet (20 mg total) by mouth daily.    Dispense:  30 tablet    Refill:  5    Order Specific Question:  Supervising Provider    Answer:  Ernestina Penna [[id number]]  . allopurinol (ZYLOPRIM) 100 MG tablet    Sig: Take 2 tablets (200 mg total) by mouth daily.    Dispense:  30 tablet    Refill:  5    Order Specific Question:  Supervising Provider    Answer:  Ernestina Penna [[id number]]   Continue all meds Labs pending Low fat diet and exercise Health maintenance reviewed  Mary-Margaret Hassell Done, FNP

## 2013-03-14 NOTE — Patient Instructions (Addendum)

## 2013-03-15 ENCOUNTER — Other Ambulatory Visit: Payer: Self-pay | Admitting: *Deleted

## 2013-03-15 DIAGNOSIS — E875 Hyperkalemia: Secondary | ICD-10-CM

## 2013-03-15 LAB — NMR LIPOPROFILE WITH LIPIDS
HDL Size: 9.9 nm (ref >=9.2)
LDL Particle Number: <300 nmol/L (ref <1000)
Large HDL-P: 14.5 umol/L (ref >=4.8)
Large VLDL-P: 8.8 nmol/L — ABNORMAL HIGH (ref <=2.7)
Small LDL Particle Number: <90 nmol/L (ref <=527)
VLDL Size: 50.5 nm — ABNORMAL HIGH (ref <=46.6)

## 2013-03-15 LAB — COMPLETE METABOLIC PANEL WITH GFR
Albumin: 4.1 g/dL (ref 3.5–5.2)
BUN: 7 mg/dL (ref 6–23)
CO2: 29 mEq/L (ref 19–32)
Calcium: 9.2 mg/dL (ref 8.4–10.5)
Chloride: 105 mEq/L (ref 96–112)
GFR, Est African American: 75 mL/min
GFR, Est Non African American: 65 mL/min
Glucose, Bld: 104 mg/dL — ABNORMAL HIGH (ref 70–99)
Potassium: 5.4 mEq/L — ABNORMAL HIGH (ref 3.5–5.3)
Total Protein: 6.6 g/dL (ref 6.0–8.3)

## 2013-03-23 ENCOUNTER — Other Ambulatory Visit (INDEPENDENT_AMBULATORY_CARE_PROVIDER_SITE_OTHER): Payer: Medicare Other

## 2013-03-23 DIAGNOSIS — E875 Hyperkalemia: Secondary | ICD-10-CM

## 2013-03-23 LAB — BASIC METABOLIC PANEL WITH GFR
CO2: 26 mEq/L (ref 19–32)
Calcium: 8.8 mg/dL (ref 8.4–10.5)
GFR, Est African American: >89 mL/min
GFR, Est Non African American: 81 mL/min
Sodium: 139 mEq/L (ref 135–145)

## 2013-03-23 NOTE — Progress Notes (Signed)
Patient came in for labs only.

## 2013-04-09 ENCOUNTER — Encounter: Payer: Self-pay | Admitting: *Deleted

## 2013-04-09 ENCOUNTER — Telehealth: Payer: Self-pay | Admitting: *Deleted

## 2013-04-09 NOTE — Telephone Encounter (Signed)
Numbers are constantly busy.

## 2013-06-07 ENCOUNTER — Telehealth: Payer: Self-pay | Admitting: Nurse Practitioner

## 2013-06-11 ENCOUNTER — Ambulatory Visit (INDEPENDENT_AMBULATORY_CARE_PROVIDER_SITE_OTHER): Payer: Medicare Other

## 2013-06-11 DIAGNOSIS — Z23 Encounter for immunization: Secondary | ICD-10-CM

## 2013-06-15 ENCOUNTER — Encounter: Payer: Self-pay | Admitting: Nurse Practitioner

## 2013-06-15 ENCOUNTER — Ambulatory Visit (INDEPENDENT_AMBULATORY_CARE_PROVIDER_SITE_OTHER): Payer: Medicare Other | Admitting: Nurse Practitioner

## 2013-06-15 VITALS — BP 144/74 | HR 69 | Temp 97.0°F | Ht 66.0 in | Wt 180.0 lb

## 2013-06-15 DIAGNOSIS — M109 Gout, unspecified: Secondary | ICD-10-CM

## 2013-06-15 DIAGNOSIS — K219 Gastro-esophageal reflux disease without esophagitis: Secondary | ICD-10-CM

## 2013-06-15 DIAGNOSIS — J438 Other emphysema: Secondary | ICD-10-CM

## 2013-06-15 DIAGNOSIS — E876 Hypokalemia: Secondary | ICD-10-CM

## 2013-06-15 DIAGNOSIS — E785 Hyperlipidemia, unspecified: Secondary | ICD-10-CM

## 2013-06-15 DIAGNOSIS — R600 Localized edema: Secondary | ICD-10-CM

## 2013-06-15 DIAGNOSIS — R609 Edema, unspecified: Secondary | ICD-10-CM

## 2013-06-15 DIAGNOSIS — I10 Essential (primary) hypertension: Secondary | ICD-10-CM

## 2013-06-15 DIAGNOSIS — F1020 Alcohol dependence, uncomplicated: Secondary | ICD-10-CM

## 2013-06-15 NOTE — Progress Notes (Signed)
Subjective:    Patient ID: Wesley [name], male    DOB: 12/02/1940, 72 y.o.   MRN: [id number]  Hyperlipidemia This is a chronic problem. The current episode started more than 1 year ago. The problem is controlled. Recent lipid tests were reviewed and are low. He has no history of diabetes. Associated symptoms include shortness of breath (DOE). Pertinent negatives include no chest pain, leg pain or myalgias. Current antihyperlipidemic treatment includes statins. The current treatment provides significant improvement of lipids. Risk factors for coronary artery disease include dyslipidemia, hypertension, male sex and family history.  Hypertension This is a chronic problem. The current episode started more than 1 year ago. The problem has been gradually improving since onset. The problem is controlled. Associated symptoms include shortness of breath (DOE). Pertinent negatives include no blurred vision, chest pain, palpitations or peripheral edema. Risk factors for coronary artery disease include dyslipidemia, family history and male gender. Past treatments include calcium channel blockers and diuretics. The current treatment provides moderate improvement. There is no history of heart failure or a thyroid problem. There is no history of sleep apnea.  Gastrophageal Reflux He complains of coughing. He reports no chest pain, no heartburn or no nausea. This is a chronic problem. The current episode started more than 1 year ago. The problem occurs rarely. The problem has been resolved. He has tried a PPI for the symptoms. The treatment provided significant relief.  COPD Pt takes Spiriva and Advair-Pt states this controls his breathing well. Hasn't needed albuterol rescue inhaler Osteoporosis Pt takes Actonel 150 mg- Works well. Pt does not know when his last dexascan was. States a least "a couple of years ago". Gout Allopurinol daily- no recent flare ups CAD/ stent placement Diltiazem and cardura daily- sees  cardiologist yearly- Will make appointment in November  Review of Systems  Eyes: Negative for blurred vision.  Respiratory: Positive for cough and shortness of breath (DOE).   Cardiovascular: Negative for chest pain and palpitations.  Gastrointestinal: Negative for heartburn and nausea.  Musculoskeletal: Negative for myalgias.  Neurological:       Pt c/o neuropathy in bilaterally in LE   All other systems reviewed and are negative.       Objective:   Physical Exam  Constitutional: He is oriented to person, place, and time. He appears well-developed and well-nourished.  HENT:  Head: Normocephalic.  Eyes: Pupils are equal, round, and reactive to light.  Neck: Normal range of motion. Neck supple. No thyromegaly present.  Cardiovascular: Normal rate, regular rhythm, normal heart sounds and intact distal pulses.   No murmur heard. Pulmonary/Chest: Effort normal and breath sounds normal.  Diminished breath sounds  Abdominal: Soft. Bowel sounds are normal. There is no tenderness. There is no rebound.  Musculoskeletal: Normal range of motion. He exhibits edema (trace amt in bil ext). He exhibits no tenderness.  Neurological: He is alert and oriented to person, place, and time.  Skin: Skin is warm and dry.  Psychiatric: He has a normal mood and affect. His behavior is normal. Judgment and thought content normal.    BP 144/74  Pulse 69  Temp(Src) 97 F (36.1 C) (Oral)  Ht 5\' 6"  (1.676 m)  Wt 180 lb (81.647 kg)  BMI 29.07 kg/m2       Assessment & Plan:   1. HYPERTENSION   2. GERD (gastroesophageal reflux disease)   3. Gout   4. Hyperlipidemia   5. Peripheral edema   6. Alcohol dependence   7. Hypokalemia  8. EMPHYSEMA    Orders Placed This Encounter  Procedures  . CMP14+EGFR  . NMR, lipoprofile  . Anemia Profile B      Continue all meds Labs pending Diet and exercise encouraged Health maintenance reviewed Follow up in 3 months  Mary-Margaret Daphine Deutscher,  FNP

## 2013-06-15 NOTE — Patient Instructions (Signed)

## 2013-06-19 LAB — NMR, LIPOPROFILE
Cholesterol: 123 mg/dL (ref <200)
LDL Particle Number: <300 nmol/L (ref <1000)
LDLC SERPL CALC-MCNC: <10 mg/dL (ref <100)
Triglycerides by NMR: 228 mg/dL — ABNORMAL HIGH (ref <150)

## 2013-06-19 LAB — ANEMIA PROFILE B
Basophils Absolute: 0.1 10*3/uL (ref 0.0–0.2)
Basos: 1 %
Eosinophils Absolute: 0.3 10*3/uL (ref 0.0–0.4)
Folate: 5.7 ng/mL (ref >3.0)
HCT: 38.8 % (ref 37.5–51.0)
Iron Saturation: 53 % (ref 15–55)
Iron: 99 ug/dL (ref 40–155)
Lymphs: 27 %
MCH: 33.9 pg — ABNORMAL HIGH (ref 26.6–33.0)
MCHC: 33.5 g/dL (ref 31.5–35.7)
Monocytes Absolute: 0.5 10*3/uL (ref 0.1–0.9)
Neutrophils Absolute: 5.1 10*3/uL (ref 1.4–7.0)
Neutrophils Relative %: 61 %
RDW: 17.1 % — ABNORMAL HIGH (ref 12.3–15.4)
Retic Ct Pct: 1.9 % (ref 0.6–2.6)
UIBC: 88 ug/dL — ABNORMAL LOW (ref 150–375)
Vitamin B-12: 474 pg/mL (ref 211–946)

## 2013-06-19 LAB — CMP14+EGFR
ALT: 12 IU/L (ref 0–44)
Albumin: 3.8 g/dL (ref 3.5–4.8)
BUN: 8 mg/dL (ref 8–27)
CO2: 24 mmol/L (ref 18–29)
Calcium: 8.9 mg/dL (ref 8.6–10.2)
Chloride: 104 mmol/L (ref 97–108)
Glucose: 82 mg/dL (ref 65–99)
Total Protein: 5.7 g/dL — ABNORMAL LOW (ref 6.0–8.5)

## 2013-09-14 ENCOUNTER — Other Ambulatory Visit: Payer: Self-pay | Admitting: Nurse Practitioner

## 2013-09-25 ENCOUNTER — Telehealth: Payer: Self-pay | Admitting: Nurse Practitioner

## 2013-09-25 ENCOUNTER — Encounter: Payer: Self-pay | Admitting: Nurse Practitioner

## 2013-09-25 ENCOUNTER — Ambulatory Visit (INDEPENDENT_AMBULATORY_CARE_PROVIDER_SITE_OTHER): Payer: Medicare Other | Admitting: Nurse Practitioner

## 2013-09-25 ENCOUNTER — Ambulatory Visit (INDEPENDENT_AMBULATORY_CARE_PROVIDER_SITE_OTHER): Payer: Medicare Other

## 2013-09-25 VITALS — BP 128/76 | HR 109 | Temp 96.8°F | Wt 168.0 lb

## 2013-09-25 DIAGNOSIS — R0602 Shortness of breath: Secondary | ICD-10-CM

## 2013-09-25 DIAGNOSIS — R000 Tachycardia, unspecified: Secondary | ICD-10-CM

## 2013-09-25 DIAGNOSIS — R0989 Other specified symptoms and signs involving the circulatory and respiratory systems: Secondary | ICD-10-CM

## 2013-09-25 DIAGNOSIS — R0609 Other forms of dyspnea: Secondary | ICD-10-CM

## 2013-09-25 LAB — POCT CBC
GRANULOCYTE PERCENT: 80.6 % — AB (ref 37–80)
HEMATOCRIT: 47 % (ref 43.5–53.7)
Hemoglobin: 14.9 g/dL (ref 14.1–18.1)
Lymph, poc: 1.2 (ref 0.6–3.4)
MCH, POC: 35.3 pg — AB (ref 27–31.2)
MCHC: 31.6 g/dL — AB (ref 31.8–35.4)
MCV: 11.7 fL — AB (ref 80–97)
MPV: 7.6 fL (ref 0–99.8)
PLATELET COUNT, POC: 157 10*3/uL (ref 142–424)
POC GRANULOCYTE: 7 — AB (ref 2–6.9)
POC LYMPH PERCENT: 13.4 %L (ref 10–50)
RBC: 4.2 M/uL — AB (ref 4.69–6.13)
RDW, POC: 13 %
WBC: 8.7 10*3/uL (ref 4.6–10.2)

## 2013-09-25 NOTE — Telephone Encounter (Signed)
appt made by gina r

## 2013-09-25 NOTE — Progress Notes (Signed)
   Subjective:    Patient ID: Wesley [name], male    DOB: November 26, 1940, 73 y.o.   MRN: [id number]  HPI Patient in c/o productive cough, nasal discharge, and chills- started 1 week ago- only OTC meds was tylenol.    Review of Systems  Constitutional: Positive for chills, appetite change and fatigue. Negative for fever.  HENT: Positive for congestion, postnasal drip and rhinorrhea. Negative for ear pain, sinus pressure, sore throat and trouble swallowing.   Respiratory: Positive for cough (productive).   Cardiovascular: Negative.   Gastrointestinal: Negative.   Genitourinary: Negative.   Musculoskeletal: Negative.   Psychiatric/Behavioral: Negative.   All other systems reviewed and are negative.       Objective:   Physical Exam  Constitutional: He is oriented to person, place, and time. He appears well-developed and well-nourished.  HENT:  Right Ear: Hearing, tympanic membrane, external ear and ear canal normal.  Left Ear: Hearing, tympanic membrane, external ear and ear canal normal.  Nose: Mucosal edema and rhinorrhea present. Right sinus exhibits no maxillary sinus tenderness and no frontal sinus tenderness. Left sinus exhibits no maxillary sinus tenderness and no frontal sinus tenderness.  Mouth/Throat: Uvula is midline and mucous membranes are normal.  Cardiovascular: Regular rhythm and normal heart sounds.   tachycardiac  Pulmonary/Chest: Effort normal and breath sounds normal.  Abdominal: Soft. Bowel sounds are normal.  Neurological: He is alert and oriented to person, place, and time.  Skin: Skin is warm and dry.  greyish skin tone  Psychiatric: He has a normal mood and affect. His behavior is normal. Judgment and thought content normal.    BP 128/76  Pulse 109  Temp(Src) 96.8 F (36 C) (Oral)  Wt 168 lb (76.204 kg)  SAO2 95% with activity- tachycardia- 130 ekg- NSR  Results for orders placed in visit on 09/25/13  POCT CBC      Result Value Range   WBC 8.7   4.6 - 10.2 K/uL   Lymph, poc 1.2  0.6 - 3.4   POC LYMPH PERCENT 13.4  10 - 50 %L   MID (cbc)    0 - 0.9   POC MID %    0 - 12 %M   POC Granulocyte 7.0 (*) 2 - 6.9   Granulocyte percent 80.6 (*) 37 - 80 %G   RBC 4.2 (*) 4.69 - 6.13 M/uL   Hemoglobin 14.9  14.1 - 18.1 g/dL   HCT, POC 47.0  43.5 - 53.7 %   MCV 11.7 (*) 80 - 97 fL   MCH, POC 35.3 (*) 27 - 31.2 pg   MCHC 31.6 (*) 31.8 - 35.4 g/dL   RDW, POC 13.0     Platelet Count, POC 157.0  142 - 424 K/uL   MPV 7.6  0 - 99.8 fL       Assessment & Plan:   1. SOB (shortness of breath)   2. DOE (dyspnea on exertion)   3. Tachycardia    To ER via EMS IV attempt X2 Report given to EMS  Mary-Margaret Hassell Done, FNP

## 2013-09-26 ENCOUNTER — Telehealth: Payer: Self-pay | Admitting: Nurse Practitioner

## 2013-09-28 ENCOUNTER — Telehealth: Payer: Self-pay | Admitting: Family Medicine

## 2013-09-28 NOTE — Telephone Encounter (Signed)
Message copied by Waverly Ferrari on Fri Sep 28, 2013  3:46 PM ------      Message from: Chevis Pretty      Created: Thu Sep 27, 2013  7:50 AM       chest x ray negative ------

## 2013-10-20 ENCOUNTER — Other Ambulatory Visit: Payer: Self-pay | Admitting: Nurse Practitioner

## 2013-10-22 ENCOUNTER — Other Ambulatory Visit: Payer: Self-pay

## 2013-11-14 ENCOUNTER — Other Ambulatory Visit: Payer: Self-pay | Admitting: *Deleted

## 2013-11-14 DIAGNOSIS — M109 Gout, unspecified: Secondary | ICD-10-CM

## 2013-11-14 MED ORDER — ALLOPURINOL 100 MG PO TABS: 200.0000 mg | ORAL_TABLET | Freq: Every day | ORAL | Status: DC

## 2013-11-26 ENCOUNTER — Other Ambulatory Visit: Payer: Self-pay | Admitting: Nurse Practitioner

## 2013-11-28 NOTE — Telephone Encounter (Signed)
Patient NTBS for follow up and lab work  

## 2013-12-19 ENCOUNTER — Other Ambulatory Visit: Payer: Self-pay | Admitting: Nurse Practitioner

## 2013-12-21 ENCOUNTER — Other Ambulatory Visit: Payer: Self-pay | Admitting: Nurse Practitioner

## 2014-02-27 ENCOUNTER — Encounter: Payer: Self-pay | Admitting: Nurse Practitioner

## 2014-02-27 ENCOUNTER — Ambulatory Visit (INDEPENDENT_AMBULATORY_CARE_PROVIDER_SITE_OTHER): Payer: Medicare Other | Admitting: Nurse Practitioner

## 2014-02-27 ENCOUNTER — Ambulatory Visit (INDEPENDENT_AMBULATORY_CARE_PROVIDER_SITE_OTHER): Payer: Medicare Other

## 2014-02-27 VITALS — BP 122/70 | HR 67 | Temp 98.0°F | Ht 66.0 in | Wt 167.8 lb

## 2014-02-27 DIAGNOSIS — J42 Unspecified chronic bronchitis: Secondary | ICD-10-CM

## 2014-02-27 DIAGNOSIS — J209 Acute bronchitis, unspecified: Secondary | ICD-10-CM

## 2014-02-27 DIAGNOSIS — R059 Cough, unspecified: Secondary | ICD-10-CM

## 2014-02-27 DIAGNOSIS — I209 Angina pectoris, unspecified: Secondary | ICD-10-CM

## 2014-02-27 DIAGNOSIS — I25810 Atherosclerosis of coronary artery bypass graft(s) without angina pectoris: Secondary | ICD-10-CM

## 2014-02-27 DIAGNOSIS — R05 Cough: Secondary | ICD-10-CM

## 2014-02-27 DIAGNOSIS — J438 Other emphysema: Secondary | ICD-10-CM

## 2014-02-27 DIAGNOSIS — I25701 Atherosclerosis of coronary artery bypass graft(s), unspecified, with angina pectoris with documented spasm: Secondary | ICD-10-CM

## 2014-02-27 MED ORDER — BENZONATATE 100 MG PO CAPS: 100.0000 mg | ORAL_CAPSULE | Freq: Two times a day (BID) | ORAL | Status: DC | PRN

## 2014-02-27 MED ORDER — AMOXICILLIN 875 MG PO TABS: 875.0000 mg | ORAL_TABLET | Freq: Two times a day (BID) | ORAL | Status: DC

## 2014-02-27 MED ORDER — CYCLOBENZAPRINE HCL 5 MG PO TABS: 5.0000 mg | ORAL_TABLET | Freq: Three times a day (TID) | ORAL | Status: DC | PRN

## 2014-02-27 NOTE — Patient Instructions (Signed)

## 2014-02-27 NOTE — Progress Notes (Signed)
   Subjective:    Patient ID: Wesley [name], male    DOB: 05-Oct-1940, 73 y.o.   MRN: [id number]  HPI Patient in c/o cough and low grade fever that started several days ago. Has tried OTC meds with no rlief. He has a history of COPD/emphysema.    Review of Systems  Constitutional: Positive for fever.  HENT: Positive for congestion.   Respiratory: Positive for cough.   Cardiovascular: Negative.   Gastrointestinal: Negative.   Genitourinary: Negative.   Neurological: Negative.   Hematological: Negative.   Psychiatric/Behavioral: Negative.   All other systems reviewed and are negative.      Objective:   Physical Exam  Constitutional: He is oriented to person, place, and time. He appears well-developed and well-nourished. No distress.  HENT:  Right Ear: External ear normal.  Left Ear: External ear normal.  Nose: Nose normal.  Mouth/Throat: Oropharynx is clear and moist.  Neck: Normal range of motion. Neck supple.  Cardiovascular: Normal rate, regular rhythm and normal heart sounds.   Pulmonary/Chest: Effort normal and breath sounds normal. No respiratory distress.  Course breath sounds  Lymphadenopathy:    He has no cervical adenopathy.  Neurological: He is alert and oriented to person, place, and time.  Skin: Skin is warm and dry.  Psychiatric: He has a normal mood and affect. His behavior is normal. Judgment and thought content normal.   BP 122/70  Pulse 67  Temp(Src) 98 F (36.7 C) (Oral)  Ht 5\' 6"  (1.676 m)  Wt 167 lb 12.8 oz (76.114 kg)  BMI 27.10 kg/m2  SpO2 94%   Chest x ray- normal- no acute findings-Preliminary reading by Ronnald Collum, FNP  [facility]       Assessment & Plan:   1. Acute exacerbation of chronic bronchitis   2. EMPHYSEMA   3. 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 amoxicillin (AMOXIL) 875 MG tablet    Sig: Take 1  tablet (875 mg total) by mouth 2 (two) times daily.    Dispense:  20 tablet    Refill:  0    Order Specific Question:  Supervising Provider    Answer:  Chipper Herb [[id number]]  . benzonatate (TESSALON) 100 MG capsule    Sig: Take 1 capsule (100 mg total) by mouth 2 (two) times daily as needed for cough.    Dispense:  20 capsule    Refill:  0    Order Specific Question:  Supervising Provider    Answer:  Chipper Herb [[id number]]   1. Take meds as prescribed 2. Use a cool mist humidifier especially during the winter months and when heat has been humid. 3. Use saline nose sprays frequently 4. Saline irrigations of the nose can be very helpful if done frequently.  * 4X daily for 1 week*  * Use of a nettie pot can be helpful with this. Follow directions with this* 5. Drink plenty of fluids 6. Keep thermostat turn down low 7.For any cough or congestion  Use plain Mucinex- regular strength or max strength is fine   * Children- consult with Pharmacist for dosing 8. For fever or aces or pains- take tylenol or ibuprofen appropriate for age and weight.  * for fevers greater than 101 orally you may alternate ibuprofen and tylenol every  3 hours.   Mary-Margaret Hassell Done, FNP

## 2014-02-27 NOTE — Addendum Note (Signed)
Addended by: Chevis Pretty on: 02/27/2014 02:18 PM   Modules accepted: Orders

## 2014-03-14 ENCOUNTER — Other Ambulatory Visit: Payer: Self-pay | Admitting: Nurse Practitioner

## 2014-04-16 ENCOUNTER — Other Ambulatory Visit: Payer: Self-pay | Admitting: Nurse Practitioner

## 2014-05-02 ENCOUNTER — Telehealth: Payer: Self-pay | Admitting: Nurse Practitioner

## 2014-05-02 MED ORDER — FLUTICASONE-SALMETEROL 250-50 MCG/DOSE IN AEPB: INHALATION_SPRAY | RESPIRATORY_TRACT | Status: DC

## 2014-05-02 MED ORDER — TIOTROPIUM BROMIDE MONOHYDRATE 18 MCG IN CAPS: ORAL_CAPSULE | RESPIRATORY_TRACT | Status: DC

## 2014-05-02 NOTE — Telephone Encounter (Signed)
I sent the Rx to the pharmacy.

## 2014-05-13 ENCOUNTER — Other Ambulatory Visit: Payer: Self-pay | Admitting: Nurse Practitioner

## 2014-05-14 NOTE — Telephone Encounter (Signed)
No refillon liptor until seen

## 2014-05-14 NOTE — Telephone Encounter (Signed)
Patient last seen in office on 02-27-14. Last labs done on 06-15-13. Please advise on refill

## 2014-06-10 ENCOUNTER — Ambulatory Visit: Payer: Medicare Other

## 2014-06-12 ENCOUNTER — Other Ambulatory Visit: Payer: Self-pay | Admitting: Nurse Practitioner

## 2014-06-18 ENCOUNTER — Other Ambulatory Visit: Payer: Self-pay | Admitting: Nurse Practitioner

## 2014-06-20 NOTE — Telephone Encounter (Signed)
Refill denied until seen for lab work

## 2014-06-20 NOTE — Telephone Encounter (Signed)
Last office visit was 02-27-14. No labs since 06-15-13. Please advise on refill

## 2014-07-13 ENCOUNTER — Other Ambulatory Visit: Payer: Self-pay | Admitting: Nurse Practitioner

## 2014-07-15 NOTE — Telephone Encounter (Signed)
Last lipids 05/2013 

## 2014-07-15 NOTE — Telephone Encounter (Signed)
Deny lipitor refill- Patient NTBS for follow up and lab work p

## 2014-07-29 ENCOUNTER — Other Ambulatory Visit: Payer: Self-pay | Admitting: Nurse Practitioner

## 2014-08-10 ENCOUNTER — Other Ambulatory Visit: Payer: Self-pay | Admitting: Nurse Practitioner

## 2014-08-21 ENCOUNTER — Other Ambulatory Visit: Payer: Self-pay | Admitting: Nurse Practitioner

## 2014-08-23 DIAGNOSIS — I252 Old myocardial infarction: Secondary | ICD-10-CM | POA: Diagnosis not present

## 2014-08-23 DIAGNOSIS — I351 Nonrheumatic aortic (valve) insufficiency: Secondary | ICD-10-CM | POA: Diagnosis not present

## 2014-08-23 DIAGNOSIS — E780 Pure hypercholesterolemia: Secondary | ICD-10-CM | POA: Diagnosis not present

## 2014-08-23 DIAGNOSIS — I2510 Atherosclerotic heart disease of native coronary artery without angina pectoris: Secondary | ICD-10-CM | POA: Diagnosis not present

## 2014-09-11 ENCOUNTER — Other Ambulatory Visit: Payer: Self-pay | Admitting: Nurse Practitioner

## 2014-09-16 ENCOUNTER — Other Ambulatory Visit: Payer: Self-pay | Admitting: Nurse Practitioner

## 2014-09-17 NOTE — Telephone Encounter (Signed)
Last seen 02/27/14 MMM   Last lipid 06/15/13

## 2014-09-23 ENCOUNTER — Encounter: Payer: Self-pay | Admitting: Internal Medicine

## 2014-10-19 ENCOUNTER — Other Ambulatory Visit: Payer: Self-pay | Admitting: Nurse Practitioner

## 2014-10-21 NOTE — Telephone Encounter (Signed)
Patient has CPE scheduled with Dr. Livia Snellen 10/25/14

## 2014-10-25 ENCOUNTER — Ambulatory Visit (INDEPENDENT_AMBULATORY_CARE_PROVIDER_SITE_OTHER): Payer: Medicare Other | Admitting: Family Medicine

## 2014-10-25 ENCOUNTER — Encounter: Payer: Self-pay | Admitting: Family Medicine

## 2014-10-25 VITALS — BP 138/70 | HR 86 | Temp 98.0°F | Ht 66.0 in | Wt 175.0 lb

## 2014-10-25 DIAGNOSIS — Z0000 Encounter for general adult medical examination without abnormal findings: Secondary | ICD-10-CM | POA: Diagnosis not present

## 2014-10-25 DIAGNOSIS — E559 Vitamin D deficiency, unspecified: Secondary | ICD-10-CM

## 2014-10-25 DIAGNOSIS — K219 Gastro-esophageal reflux disease without esophagitis: Secondary | ICD-10-CM

## 2014-10-25 DIAGNOSIS — K439 Ventral hernia without obstruction or gangrene: Secondary | ICD-10-CM

## 2014-10-25 DIAGNOSIS — Z1212 Encounter for screening for malignant neoplasm of rectum: Secondary | ICD-10-CM | POA: Diagnosis not present

## 2014-10-25 DIAGNOSIS — I25810 Atherosclerosis of coronary artery bypass graft(s) without angina pectoris: Secondary | ICD-10-CM | POA: Diagnosis not present

## 2014-10-25 DIAGNOSIS — J449 Chronic obstructive pulmonary disease, unspecified: Secondary | ICD-10-CM

## 2014-10-25 DIAGNOSIS — E785 Hyperlipidemia, unspecified: Secondary | ICD-10-CM

## 2014-10-25 DIAGNOSIS — M25519 Pain in unspecified shoulder: Secondary | ICD-10-CM

## 2014-10-25 DIAGNOSIS — I10 Essential (primary) hypertension: Secondary | ICD-10-CM | POA: Diagnosis not present

## 2014-10-25 DIAGNOSIS — I25701 Atherosclerosis of coronary artery bypass graft(s), unspecified, with angina pectoris with documented spasm: Secondary | ICD-10-CM | POA: Diagnosis not present

## 2014-10-25 DIAGNOSIS — I209 Angina pectoris, unspecified: Secondary | ICD-10-CM | POA: Diagnosis not present

## 2014-10-25 LAB — POCT CBC
GRANULOCYTE PERCENT: 60.8 % (ref 37–80)
HCT, POC: 46.4 % (ref 43.5–53.7)
Hemoglobin: 13.8 g/dL — AB (ref 14.1–18.1)
Lymph, poc: 2.4 (ref 0.6–3.4)
MCH: 30.3 pg (ref 27–31.2)
MCHC: 29.7 g/dL — AB (ref 31.8–35.4)
MCV: 102 fL — AB (ref 80–97)
MPV: 6.7 fL (ref 0–99.8)
POC Granulocyte: 4.6 (ref 2–6.9)
POC LYMPH PERCENT: 31.9 %L (ref 10–50)
Platelet Count, POC: 337 10*3/uL (ref 142–424)
RBC: 4.54 M/uL — AB (ref 4.69–6.13)
RDW, POC: 16.1 %
WBC: 7.5 10*3/uL (ref 4.6–10.2)

## 2014-10-25 LAB — POCT URINALYSIS DIPSTICK
Bilirubin, UA: NEGATIVE
Blood, UA: NEGATIVE
Glucose, UA: NEGATIVE
Ketones, UA: NEGATIVE
Leukocytes, UA: NEGATIVE
NITRITE UA: NEGATIVE
PH UA: 6
Protein, UA: NEGATIVE
Spec Grav, UA: 1.01
UROBILINOGEN UA: NEGATIVE

## 2014-10-25 LAB — POCT UA - MICROSCOPIC ONLY
Bacteria, U Microscopic: NEGATIVE
CASTS, UR, LPF, POC: NEGATIVE
Crystals, Ur, HPF, POC: NEGATIVE
Mucus, UA: NEGATIVE
RBC, URINE, MICROSCOPIC: NEGATIVE
WBC, Ur, HPF, POC: NEGATIVE
Yeast, UA: NEGATIVE

## 2014-10-25 NOTE — Patient Instructions (Addendum)
Medicare Annual Wellness Visit  [facility] and the medical providers at [facility] strive to bring you the best medical care.  In doing so we not only want to address your current medical conditions and concerns but also to detect new conditions early and prevent illness, disease and health-related problems.    Medicare offers a yearly Wellness Visit which allows our clinical staff to assess your need for preventative services including immunizations, lifestyle education, counseling to decrease risk of preventable diseases and screening for fall risk and other medical concerns.    This visit is provided free of charge (no copay) for all Medicare recipients. The clinical pharmacists at [facility] have begun to conduct these Wellness Visits which will also include a thorough review of all your medications.    As you primary medical provider recommend that you make an appointment for your Annual Wellness Visit if you have not done so already this year.  You may set up this appointment before you leave today or you may call back ([phone]) and schedule an appointment.  Please make sure when you call that you mention that you are scheduling your Annual Wellness Visit with the clinical pharmacist so that the appointment may be made for the proper length of time.     Continue current medications. Continue good therapeutic lifestyle changes which include good diet and exercise. Fall precautions discussed with patient. If an FOBT was given today- please return it to our front desk. If you are over 85 years old - you may need Prevnar 87 or the adult Pneumonia vaccine.  Flu Shots are still available at our office. If you still haven't had one please call to set up a nurse visit to get one.   After your visit with [place] today you will receive a survey in the mail or online from [employer] regarding your care with [place]. Please take a moment to  fill this out. Your feedback is very important to [place] as you can help [place] better understand your patient needs as well as improve your experience and satisfaction. WE CARE ABOUT YOU!!!    Use cetaphil Lotion twice daily on the dry skin of your arms and legs

## 2014-10-25 NOTE — Progress Notes (Signed)
Subjective:    Patient ID: Wesley [name], male    DOB: 08/27/1940, 74 y.o.   MRN: [id number]  HPI Patient is here today for annual wellness exam and follow up of chronic medical problems which includes CAD, hypertension and hyperlipidemia. He is taking medications regularly.   follow-up of hypertension. Patient has no history of headache chest pain or shortness of breath or recent cough. Patient also denies symptoms of TIA such as numbness weakness lateralizing. Patient checks  blood pressure at home and has not had any elevated readings recently. Patient denies side effects from his medication. States taking it regularly. Patient in for follow-up of elevated cholesterol. Doing well without complaints on current medication. Denies side effects of statin including myalgia and arthralgia and nausea. Also in today for liver function testing. Currently no chest pain, shortness of breath or other cardiovascular related symptoms noted. Concerned about hernia in the midline where he had surgery recently. It does not cause pain but it bulges and he would like to have it removed if possible. Patient in for follow-up of GERD. Currently asymptomatic taking  PPI daily. There is no chest pain or heartburn. No hematemesis and no melena. No dysphagia or choking. Onset is remote. Progression is stable. Complicating factors, none. Patient also has a history of coronary artery disease and has had bypass. Fortunately no recent problems with chest pain or shortness of breath. He does have a history of COPD. He continues to use his Advair. He denies any exacerbation recently.   No Known Allergies  Outpatient Encounter Prescriptions as of 10/25/2014  Medication Sig  . ADVAIR DISKUS 250-50 MCG/DOSE AEPB INHALE 1 PUFF INTO THE LUNGS EVERY 12 (TWELVE) HOURS.  Marland Kitchen allopurinol (ZYLOPRIM) 100 MG tablet Take 2 tablets (200 mg total) by mouth daily.  Marland Kitchen aspirin 81 MG tablet Take 81 mg by mouth daily.  Marland Kitchen atorvastatin  (LIPITOR) 20 MG tablet TAKE 1 TABLET (20 MG TOTAL) BY MOUTH DAILY.  . Calcium 600-200 MG-UNIT per tablet Take 1 tablet by mouth 2 (two) times daily.    Marland Kitchen diltiazem (CARDIZEM CD) 240 MG 24 hr capsule Take 1 capsule (240 mg total) by mouth daily.  Marland Kitchen doxazosin (CARDURA) 2 MG tablet TAKE ONE TABLET BY MOUTH AT BEDTIME  . Ferrous Gluconate (IRON) 246 (28 FE) MG TABS Once daily   . furosemide (LASIX) 20 MG tablet TAKE ONE TABLET BY MOUTH EVERY DAY  . omeprazole (PRILOSEC) 20 MG capsule TAKE 2 CAPSULES BY MOUTH EVERY DAY  . potassium chloride (KLOR-CON 10) 10 MEQ tablet Take 1 tablet (10 mEq total) by mouth 2 (two) times daily.  . risedronate (ACTONEL) 150 MG tablet TAKE 1 TABLET (150 MG TOTAL) BY MOUTH EVERY 30 (THIRTY) DAYS WITH WATER ON EMPTY STOMACH, NOTHING BY MOUTH OR LIE DOWN FOR NEXT 30 MINUTES.  Marland Kitchen SPIRIVA HANDIHALER 18 MCG inhalation capsule PLACE 1 CAPSULE (18 MCG TOTAL) INTO INHALER AND INHALE DAILY.  . [DISCONTINUED] aspirin 325 MG tablet Take 325 mg by mouth daily.    . [DISCONTINUED] benzonatate (TESSALON) 100 MG capsule Take 1 capsule (100 mg total) by mouth 2 (two) times daily as needed for cough.  . [DISCONTINUED] cyclobenzaprine (FLEXERIL) 5 MG tablet Take 1 tablet (5 mg total) by mouth 3 (three) times daily as needed for muscle spasms.  . [DISCONTINUED] amoxicillin (AMOXIL) 875 MG tablet Take 1 tablet (875 mg total) by mouth 2 (two) times daily.    Past Medical History  Diagnosis Date  . Heart attack   .  Hypertension   . Emphysema   . Osteoarthritis   . COPD (chronic obstructive pulmonary disease)   . Heart murmur   . CAD (coronary artery disease)   . GERD (gastroesophageal reflux disease)   . DDD (degenerative disc disease)   . Gout   . Skin cancer of arm 6 yes ago    Past Surgical History  Procedure Laterality Date  . Cholecystectomy    . Knee surgery  1959    left knee  . Finger surgery  1996    right index finger  . Zenker's diverticulectomy  Jan 10, 2005  .  Coronary angioplasty with stent placement  11/11/2006    x 2  . Total hip arthroplasty  07/2008    left hip  . Shoulder surgery  1982    right shoulder  . Hernia repair  1975    History   Social History  . Marital Status: Single    Spouse Name: N/A  . Number of Children: 2  . Years of Education: N/A   Occupational History  . security     retired   Social History Main Topics  . Smoking status: Former Smoker -- 2.50 packs/day for 28 years    Quit date: 08/17/1983  . Smokeless tobacco: Never Used  . Alcohol Use: 1.2 oz/week    2 Cans of beer per week     Comment: 2 beers daily  . Drug Use: No  . Sexual Activity: Not on file   Other Topics Concern  . Not on file   Social History Narrative   Family History  Problem Relation Age of Onset  . Heart attack Father   . Heart attack Mother   . Diabetes Father        Patient Active Problem List   Diagnosis Date Noted  . Alcohol dependence 06/15/2013  . Gout 12/11/2012  . Hyperlipidemia 12/11/2012  . Peripheral edema 12/11/2012  . CAD (coronary artery disease) of artery bypass graft 12/11/2012  . Hypokalemia 12/11/2012  . Barrett's esophagus 09/28/2011  . Personal history of colonic polyps - adenoma 09/28/2011  . GERD (gastroesophageal reflux disease) 08/20/2011  . 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 HYPERTENSION 04/08/2010  . EMPHYSEMA 04/08/2010      Review of Systems  Constitutional: Negative.  Negative for fever, chills, diaphoresis, activity change, appetite change, fatigue and unexpected weight change.  HENT: Negative.  Negative for congestion, ear pain, hearing loss, postnasal drip, rhinorrhea, sore throat, tinnitus and trouble swallowing.   Eyes: Negative.  Negative for photophobia, pain, discharge and redness.  Respiratory: Negative.  Negative for apnea, cough, choking, chest tightness, shortness of breath, wheezing and stridor.   Cardiovascular: Negative.  Negative for chest pain,  palpitations and leg swelling.  Gastrointestinal: Negative.  Negative for nausea, vomiting, abdominal pain, diarrhea, constipation, blood in stool and abdominal distention.  Endocrine: Negative.  Negative for cold intolerance, heat intolerance, polydipsia, polyphagia and polyuria.  Genitourinary: Negative.  Negative for dysuria, urgency, frequency, hematuria, flank pain, enuresis, difficulty urinating and genital sores.  Musculoskeletal: Positive for neck pain (arthritis). Negative for joint swelling and arthralgias.  Skin: Negative.  Negative for color change, rash and wound.  Allergic/Immunologic: Negative.  Negative for immunocompromised state.  Neurological: Negative.  Negative for dizziness, tremors, seizures, syncope, facial asymmetry, speech difficulty, weakness, light-headedness, numbness and headaches.  Hematological: Negative.  Does not bruise/bleed easily.  Psychiatric/Behavioral: Negative.  Negative for suicidal ideas, hallucinations, behavioral problems, confusion, sleep disturbance, dysphoric mood, decreased concentration and agitation. The  patient is not nervous/anxious and is not hyperactive.        Objective:   Physical Exam  Constitutional: He is oriented to person, place, and time. He appears well-developed and well-nourished.  HENT:  Head: Normocephalic and atraumatic.  Mouth/Throat: Oropharynx is clear and moist.  Eyes: EOM are normal. Pupils are equal, round, and reactive to light.  Neck: Normal range of motion. No tracheal deviation present. No thyromegaly present.  Cardiovascular: Normal rate, regular rhythm and normal heart sounds.  Exam reveals no gallop and no friction rub.   No murmur heard. Pulmonary/Chest: Breath sounds normal. He has no wheezes. He has no rales.  Abdominal: Soft. He exhibits no mass. There is no tenderness. A hernia is present. Hernia confirmed positive in the ventral area.    Musculoskeletal: Normal range of motion. He exhibits no edema.    Neurological: He is alert and oriented to person, place, and time.  Skin: Skin is warm and dry.  Psychiatric: He has a normal mood and affect.   BP 138/70 mmHg  Pulse 86  Temp(Src) 98 F (36.7 C) (Oral)  Ht 5' 6"  (1.676 m)  Wt 175 lb (79.379 kg)  BMI 28.26 kg/m2        Assessment & Plan:   1. Annual physical exam   2. 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 Gastroesophageal reflux disease, esophagitis presence not specified   4. Hyperlipidemia   5. Chronic obstructive pulmonary disease, unspecified COPD, unspecified chronic bronchitis type   6. Essential hypertension, benign   7. Vitamin D deficiency   8. Pain in joint, shoulder region, unspecified laterality   9. Ventral hernia without obstruction or gangrene     Meds ordered this encounter  Medications  . aspirin 81 MG tablet    Sig: Take 81 mg by mouth daily.    Orders Placed This Encounter  Procedures  . CMP14+EGFR  . NMR, lipoprofile  . PSA, total and free  . Vit D  25 hydroxy (rtn osteoporosis monitoring)  . Ambulatory referral to Orthopedic Surgery    Referral Priority:  Routine    Referral Type:  Surgical    Referral Reason:  Specialty Services Required    Requested Specialty:  Orthopedic Surgery    Number of Visits Requested:  1  . Ambulatory referral to General Surgery    Referral Priority:  Routine    Referral Type:  Surgical    Referral Reason:  Specialty Services Required    Requested Specialty:  General Surgery    Number of Visits Requested:  1  . POCT urinalysis dipstick  . POCT UA - Microscopic Only  . POCT CBC  . EKG 12-Lead    Labs pending Health Maintenance reviewed Diet and exercise encouraged Continue all meds as discussed Follow up in 6 mos   Claretta Fraise, MD

## 2014-10-27 LAB — FECAL OCCULT BLOOD, IMMUNOCHEMICAL: FECAL OCCULT BLD: POSITIVE — AB

## 2014-10-27 LAB — CMP14+EGFR
A/G RATIO: 1.9 (ref 1.1–2.5)
ALBUMIN: 3.7 g/dL (ref 3.5–4.8)
ALT: 13 IU/L (ref 0–44)
AST: 17 IU/L (ref 0–40)
Alkaline Phosphatase: 131 IU/L — ABNORMAL HIGH (ref 39–117)
BUN/Creatinine Ratio: 16 (ref 10–22)
BUN: 13 mg/dL (ref 8–27)
Bilirubin Total: 0.6 mg/dL (ref 0.0–1.2)
CHLORIDE: 105 mmol/L (ref 97–108)
CO2: 25 mmol/L (ref 18–29)
Calcium: 8.9 mg/dL (ref 8.6–10.2)
Creatinine, Ser: 0.82 mg/dL (ref 0.76–1.27)
GFR calc Af Amer: 101 mL/min/{1.73_m2} (ref >59)
GFR calc non Af Amer: 87 mL/min/{1.73_m2} (ref >59)
GLUCOSE: 97 mg/dL (ref 65–99)
Globulin, Total: 1.9 g/dL (ref 1.5–4.5)
Potassium: 4.3 mmol/L (ref 3.5–5.2)
Sodium: 145 mmol/L — ABNORMAL HIGH (ref 134–144)
TOTAL PROTEIN: 5.6 g/dL — AB (ref 6.0–8.5)

## 2014-10-27 LAB — VITAMIN D 25 HYDROXY (VIT D DEFICIENCY, FRACTURES): VIT D 25 HYDROXY: 45.5 ng/mL (ref 30.0–100.0)

## 2014-10-27 LAB — NMR, LIPOPROFILE
Cholesterol: 104 mg/dL (ref 100–199)
HDL Cholesterol by NMR: 61 mg/dL (ref >39)
HDL Particle Number: 27.1 umol/L — ABNORMAL LOW (ref >=30.5)
LDL-C: 20 mg/dL (ref 0–99)
LP-IR Score: <25 (ref <=45)
Small LDL Particle Number: <90 nmol/L (ref <=527)
Triglycerides by NMR: 116 mg/dL (ref 0–149)

## 2014-10-27 LAB — PSA, TOTAL AND FREE
PSA FREE: 0.16 ng/mL
PSA, Free Pct: 53.3 %
PSA: 0.3 ng/mL (ref 0.0–4.0)

## 2014-10-28 ENCOUNTER — Telehealth: Payer: Self-pay | Admitting: Nurse Practitioner

## 2014-10-28 NOTE — Telephone Encounter (Signed)
-----   Message from Claretta Fraise, MD sent at 10/27/2014  6:44 PM EDT ----- Blood noted in stool. All of the rest of the labs were fine. It may be nothing but hemorrhoids but GI referral is recommended for possible colonoscopy.

## 2014-10-31 NOTE — Telephone Encounter (Signed)
-----   Message from Claretta Fraise, MD sent at 10/27/2014  6:44 PM EDT ----- Blood noted in stool. All of the rest of the labs were fine. It may be nothing but hemorrhoids but GI referral is recommended for possible colonoscopy.

## 2014-11-05 ENCOUNTER — Encounter: Payer: Self-pay | Admitting: Nurse Practitioner

## 2014-11-13 NOTE — Telephone Encounter (Signed)
Patient aware.

## 2014-11-15 ENCOUNTER — Other Ambulatory Visit: Payer: Self-pay | Admitting: Nurse Practitioner

## 2014-11-21 ENCOUNTER — Ambulatory Visit: Payer: Self-pay | Admitting: Surgery

## 2014-11-21 DIAGNOSIS — K430 Incisional hernia with obstruction, without gangrene: Secondary | ICD-10-CM | POA: Diagnosis not present

## 2014-12-13 ENCOUNTER — Inpatient Hospital Stay (HOSPITAL_COMMUNITY): Admission: RE | Admit: 2014-12-13 | Payer: Medicare Other | Source: Ambulatory Visit

## 2015-01-07 ENCOUNTER — Ambulatory Visit (HOSPITAL_COMMUNITY): Admission: RE | Admit: 2015-01-07 | Payer: Medicare Other | Source: Ambulatory Visit | Admitting: Surgery

## 2015-01-07 ENCOUNTER — Encounter (HOSPITAL_COMMUNITY): Admission: RE | Payer: Self-pay | Source: Ambulatory Visit

## 2015-01-07 SURGERY — REPAIR, HERNIA, INCISIONAL
Anesthesia: General

## 2015-04-22 DIAGNOSIS — H3531 Nonexudative age-related macular degeneration: Secondary | ICD-10-CM | POA: Diagnosis not present

## 2015-04-24 ENCOUNTER — Other Ambulatory Visit: Payer: Self-pay | Admitting: Family Medicine

## 2015-04-28 ENCOUNTER — Ambulatory Visit: Payer: Medicare Other | Admitting: Family Medicine

## 2015-04-29 ENCOUNTER — Encounter: Payer: Self-pay | Admitting: Nurse Practitioner

## 2015-05-05 ENCOUNTER — Ambulatory Visit (INDEPENDENT_AMBULATORY_CARE_PROVIDER_SITE_OTHER): Payer: Medicare Other | Admitting: Family Medicine

## 2015-05-05 ENCOUNTER — Encounter: Payer: Self-pay | Admitting: Family Medicine

## 2015-05-05 VITALS — BP 132/62 | HR 66 | Temp 97.7°F | Ht 66.0 in | Wt 172.8 lb

## 2015-05-05 DIAGNOSIS — Z23 Encounter for immunization: Secondary | ICD-10-CM | POA: Diagnosis not present

## 2015-05-05 DIAGNOSIS — I10 Essential (primary) hypertension: Secondary | ICD-10-CM | POA: Diagnosis not present

## 2015-05-05 DIAGNOSIS — J438 Other emphysema: Secondary | ICD-10-CM | POA: Diagnosis not present

## 2015-05-05 DIAGNOSIS — E876 Hypokalemia: Secondary | ICD-10-CM

## 2015-05-05 DIAGNOSIS — M1000 Idiopathic gout, unspecified site: Secondary | ICD-10-CM | POA: Diagnosis not present

## 2015-05-05 DIAGNOSIS — E785 Hyperlipidemia, unspecified: Secondary | ICD-10-CM | POA: Diagnosis not present

## 2015-05-05 DIAGNOSIS — K219 Gastro-esophageal reflux disease without esophagitis: Secondary | ICD-10-CM | POA: Diagnosis not present

## 2015-05-05 MED ORDER — DILTIAZEM HCL ER COATED BEADS 240 MG PO CP24: ORAL_CAPSULE | ORAL | Status: AC

## 2015-05-05 MED ORDER — ALLOPURINOL 100 MG PO TABS: 200.0000 mg | ORAL_TABLET | Freq: Every day | ORAL | Status: AC

## 2015-05-05 MED ORDER — RISEDRONATE SODIUM 150 MG PO TABS: ORAL_TABLET | ORAL | Status: AC

## 2015-05-05 MED ORDER — ATORVASTATIN CALCIUM 20 MG PO TABS: ORAL_TABLET | ORAL | Status: AC

## 2015-05-05 MED ORDER — OMEPRAZOLE 20 MG PO CPDR: 40.0000 mg | DELAYED_RELEASE_CAPSULE | Freq: Every day | ORAL | Status: AC

## 2015-05-05 MED ORDER — FLUTICASONE-SALMETEROL 250-50 MCG/DOSE IN AEPB
INHALATION_SPRAY | RESPIRATORY_TRACT | Status: DC
Start: 2015-05-05 — End: 2015-05-05

## 2015-05-05 MED ORDER — TIOTROPIUM BROMIDE MONOHYDRATE 18 MCG IN CAPS: ORAL_CAPSULE | RESPIRATORY_TRACT | Status: AC

## 2015-05-05 MED ORDER — FUROSEMIDE 20 MG PO TABS: 20.0000 mg | ORAL_TABLET | Freq: Every day | ORAL | Status: AC

## 2015-05-05 MED ORDER — POTASSIUM CHLORIDE ER 10 MEQ PO TBCR: 10.0000 meq | EXTENDED_RELEASE_TABLET | Freq: Two times a day (BID) | ORAL | Status: AC

## 2015-05-05 MED ORDER — FLUTICASONE-SALMETEROL 500-50 MCG/DOSE IN AEPB: 1.0000 | INHALATION_SPRAY | Freq: Two times a day (BID) | RESPIRATORY_TRACT | Status: AC

## 2015-05-05 NOTE — Progress Notes (Signed)
Subjective:  Patient ID: Wesley [name], male    DOB: September 19, 1940  Age: 74 y.o. MRN: [id number]  CC: Hyperlipidemia; Hypertension; and Gastrophageal Reflux   HPI Wesley [name] presents for  follow-up of hypertension. Patient has no history of headache chest pain or shortness of breath or recent cough. Patient also denies symptoms of TIA such as numbness weakness lateralizing. Patient checks  blood pressure at home and has not had any elevated readings recently. Patient denies side effects from his medication. States taking it regularly.  Patient also  in for follow-up of elevated cholesterol. Doing well without complaints on current medication. Denies side effects of statin including myalgia and arthralgia and nausea. Also in today for liver function testing. Currently no chest pain, shortness of breath or other cardiovascular related symptoms noted.  Patient in for follow-up of GERD. Currently asymptomatic taking  PPI daily. There is no chest pain or heartburn. No hematemesis and no melena. No dysphagia or choking. Onset is remote. Progression is stable. Complicating factors, none. Patient also in for follow-up of COPD. Shortness of breath under good control as long as he uses his inhalers as noted below Advair, Spiriva.  No recent outbreak of his gout as long as he takes his Zyloprim 200 mg daily.   History Zakary has a past medical history of Heart attack; Hypertension; Emphysema; Osteoarthritis; COPD (chronic obstructive pulmonary disease); Heart murmur; CAD (coronary artery disease); GERD (gastroesophageal reflux disease); DDD (degenerative disc disease); Gout; and Skin cancer of arm (6 yes ago).   He has past surgical history that includes Cholecystectomy; Knee surgery (1959); Finger surgery (1996); Zenker's diverticulectomy (Jan 10, 2005); Coronary angioplasty with stent (11/11/2006); Total hip arthroplasty (07/2008); Shoulder surgery (1982); and Hernia repair (1975).   His family  history includes Diabetes in his father; Heart attack in his father and mother.He reports that he quit smoking about 31 years ago. He has never used smokeless tobacco. He reports that he drinks about 1.2 oz of alcohol per week. He reports that he does not use illicit drugs.  Current Outpatient Prescriptions on File Prior to Visit  Medication Sig Dispense Refill  . aspirin 81 MG tablet Take 81 mg by mouth daily.    . Calcium 600-200 MG-UNIT per tablet Take 1 tablet by mouth 2 (two) times daily.      Marland Kitchen doxazosin (CARDURA) 2 MG tablet TAKE ONE TABLET BY MOUTH AT BEDTIME 30 tablet 3  . Ferrous Gluconate (IRON) 246 (28 FE) MG TABS Once daily      No current facility-administered medications on file prior to visit.    ROS Review of Systems  Constitutional: Negative for fever, chills and diaphoresis.  HENT: Negative for congestion, rhinorrhea and sore throat.   Respiratory: Negative for cough, shortness of breath and wheezing.   Cardiovascular: Negative for chest pain.  Gastrointestinal: Negative for nausea, vomiting, abdominal pain, diarrhea, constipation and abdominal distention.  Genitourinary: Negative for dysuria and frequency.  Musculoskeletal: Negative for joint swelling and arthralgias.  Skin: Negative for rash.  Neurological: Negative for headaches.    Objective:  BP 132/62 mmHg  Pulse 66  Temp(Src) 97.7 F (36.5 C) (Oral)  Ht 5' 6"  (1.676 m)  Wt 172 lb 12.8 oz (78.382 kg)  BMI 27.90 kg/m2  BP Readings from Last 3 Encounters:  05/05/15 132/62  10/25/14 138/70  02/27/14 122/70    Wt Readings from Last 3 Encounters:  05/05/15 172 lb 12.8 oz (78.382 kg)  10/25/14 175 lb (79.379 kg)  02/27/14 167 lb 12.8 oz (76.114 kg)     Physical Exam  Constitutional: He is oriented to person, place, and time. He appears well-developed and well-nourished. No distress.  HENT:  Head: Normocephalic and atraumatic.  Right Ear: External ear normal.  Left Ear: External ear normal.    Nose: Nose normal.  Mouth/Throat: Oropharynx is clear and moist.  Eyes: Conjunctivae and EOM are normal. Pupils are equal, round, and reactive to light.  Neck: Normal range of motion. Neck supple. No thyromegaly present.  Cardiovascular: Normal rate, regular rhythm and normal heart sounds.   No murmur heard. Pulmonary/Chest: Effort normal and breath sounds normal. No respiratory distress. He has no wheezes. He has no rales.  Abdominal: Soft. Bowel sounds are normal. He exhibits no distension. There is no tenderness.  Lymphadenopathy:    He has no cervical adenopathy.  Neurological: He is alert and oriented to person, place, and time. He has normal reflexes.  Skin: Skin is warm and dry.  Psychiatric: He has a normal mood and affect. His behavior is normal. Judgment and thought content normal.    No results found for: HGBA1C  Lab Results  Component Value Date   WBC 7.5 10/25/2014   HGB 13.8* 10/25/2014   HCT 46.4 10/25/2014   PLT 227 06/15/2013   GLUCOSE 97 10/25/2014   CHOL 104 10/25/2014   TRIG 116 10/25/2014   HDL 61 10/25/2014   LDLCALC < 10 06/15/2013   ALT 13 10/25/2014   AST 17 10/25/2014   NA 145* 10/25/2014   K 4.3 10/25/2014   CL 105 10/25/2014   CREATININE 0.82 10/25/2014   BUN 13 10/25/2014   CO2 25 10/25/2014   PSA 0.3 10/25/2014   INR 1.0 07/18/2008    PFT -severe obstruction  Assessment & Plan:   Jaqualin was seen today for hyperlipidemia, hypertension and gastrophageal reflux.  Diagnoses and all orders for this visit:  Essential hypertension -     CMP14+EGFR -     CBC with Differential/Platelet  Hyperlipidemia -     CMP14+EGFR -     Lipid panel  Idiopathic gout, unspecified chronicity, unspecified site -     allopurinol (ZYLOPRIM) 100 MG tablet; Take 2 tablets (200 mg total) by mouth daily. -     Uric acid  Gastroesophageal reflux disease without esophagitis -     CBC with Differential/Platelet  EMPHYSEMA -     PR BREATHING CAPACITY  TEST  Hypokalemia -     potassium chloride (KLOR-CON 10) 10 MEQ tablet; Take 1 tablet (10 mEq total) by mouth 2 (two) times daily.  Other orders -     Pneumococcal conjugate vaccine 13-valent -     Discontinue: Fluticasone-Salmeterol (ADVAIR DISKUS) 250-50 MCG/DOSE AEPB; INHALE 1 PUFF INTO THE LUNGS EVERY 12 (TWELVE) HOURS. -     atorvastatin (LIPITOR) 20 MG tablet; TAKE 1 TABLET (20 MG TOTAL) BY MOUTH DAILY. -     diltiazem (CARDIZEM CD) 240 MG 24 hr capsule; TAKE 1 CAPSULE (240 MG TOTAL) BY MOUTH DAILY. -     furosemide (LASIX) 20 MG tablet; Take 1 tablet (20 mg total) by mouth daily. -     omeprazole (PRILOSEC) 20 MG capsule; Take 2 capsules (40 mg total) by mouth daily. -     risedronate (ACTONEL) 150 MG tablet; TAKE 1 TABLET (150 MG TOTAL) BY MOUTH EVERY 30 (THIRTY) DAYS WITH WATER ON EMPTY STOMACH, NOTHING BY MOUTH OR LIE DOWN FOR NEXT 30 MINUTES. -  tiotropium (SPIRIVA HANDIHALER) 18 MCG inhalation capsule; PLACE 1 CAPSULE (18 MCG TOTAL) INTO INHALER AND INHALE DAILY. -     Fluticasone-Salmeterol (ADVAIR DISKUS) 500-50 MCG/DOSE AEPB; Inhale 1 puff into the lungs 2 (two) times daily. To help with breathing   I have discontinued Mr. Franchino ADVAIR DISKUS, DILT-XR, and Fluticasone-Salmeterol. I have changed his Hume to tiotropium. I have also changed his furosemide and omeprazole. Additionally, I am having him start on Fluticasone-Salmeterol. Lastly, I am having him maintain his Calcium, Iron, doxazosin, aspirin, allopurinol, atorvastatin, diltiazem, potassium chloride, and risedronate.  Meds ordered this encounter  Medications  . DISCONTD: Fluticasone-Salmeterol (ADVAIR DISKUS) 250-50 MCG/DOSE AEPB    Sig: INHALE 1 PUFF INTO THE LUNGS EVERY 12 (TWELVE) HOURS.    Dispense:  60 each    Refill:  5  . allopurinol (ZYLOPRIM) 100 MG tablet    Sig: Take 2 tablets (200 mg total) by mouth daily.    Dispense:  30 tablet    Refill:  5  . atorvastatin (LIPITOR) 20 MG tablet     Sig: TAKE 1 TABLET (20 MG TOTAL) BY MOUTH DAILY.    Dispense:  30 tablet    Refill:  5    Generic QQV:ZDGLOVF 20MG  . diltiazem (CARDIZEM CD) 240 MG 24 hr capsule    Sig: TAKE 1 CAPSULE (240 MG TOTAL) BY MOUTH DAILY.    Dispense:  30 capsule    Refill:  5    Generic IEP:PIRJJOAC CD  240MG/24  . furosemide (LASIX) 20 MG tablet    Sig: Take 1 tablet (20 mg total) by mouth daily.    Dispense:  30 tablet    Refill:  5    Generic For:*LASIX  20MG  . omeprazole (PRILOSEC) 20 MG capsule    Sig: Take 2 capsules (40 mg total) by mouth daily.    Dispense:  60 capsule    Refill:  5    Generic For:*PRILOSEC  20MG CR  . potassium chloride (KLOR-CON 10) 10 MEQ tablet    Sig: Take 1 tablet (10 mEq total) by mouth 2 (two) times daily.    Dispense:  60 tablet    Refill:  5  . risedronate (ACTONEL) 150 MG tablet    Sig: TAKE 1 TABLET (150 MG TOTAL) BY MOUTH EVERY 30 (THIRTY) DAYS WITH WATER ON EMPTY STOMACH, NOTHING BY MOUTH OR LIE DOWN FOR NEXT 30 MINUTES.    Dispense:  1 tablet    Refill:  11  . tiotropium (SPIRIVA HANDIHALER) 18 MCG inhalation capsule    Sig: PLACE 1 CAPSULE (18 MCG TOTAL) INTO INHALER AND INHALE DAILY.    Dispense:  30 capsule    Refill:  5  . Fluticasone-Salmeterol (ADVAIR DISKUS) 500-50 MCG/DOSE AEPB    Sig: Inhale 1 puff into the lungs 2 (two) times daily. To help with breathing    Dispense:  1 each    Refill:  11    Replaces 250/50 sent in earlier today.     Follow-up: Return in about 6 months (around 11/02/2015).  Claretta Fraise, M.D.

## 2015-05-05 NOTE — Patient Instructions (Signed)

## 2015-05-06 LAB — CBC WITH DIFFERENTIAL/PLATELET
BASOS: 1 %
Basophils Absolute: 0.1 10*3/uL (ref 0.0–0.2)
EOS (ABSOLUTE): 0.3 10*3/uL (ref 0.0–0.4)
Eos: 4 %
HEMATOCRIT: 41.6 % (ref 37.5–51.0)
HEMOGLOBIN: 13.6 g/dL (ref 12.6–17.7)
Immature Grans (Abs): 0 10*3/uL (ref 0.0–0.1)
Immature Granulocytes: 0 %
Lymphocytes Absolute: 1.8 10*3/uL (ref 0.7–3.1)
Lymphs: 20 %
MCH: 32.3 pg (ref 26.6–33.0)
MCHC: 32.7 g/dL (ref 31.5–35.7)
MCV: 99 fL — ABNORMAL HIGH (ref 79–97)
MONOCYTES: 5 %
Monocytes Absolute: 0.5 10*3/uL (ref 0.1–0.9)
NEUTROS ABS: 6.1 10*3/uL (ref 1.4–7.0)
Neutrophils: 70 %
Platelets: 316 10*3/uL (ref 150–379)
RBC: 4.21 x10E6/uL (ref 4.14–5.80)
RDW: 15 % (ref 12.3–15.4)
WBC: 8.7 10*3/uL (ref 3.4–10.8)

## 2015-05-06 LAB — CMP14+EGFR
ALBUMIN: 3.8 g/dL (ref 3.5–4.8)
ALT: 11 IU/L (ref 0–44)
AST: 15 IU/L (ref 0–40)
Albumin/Globulin Ratio: 1.8 (ref 1.1–2.5)
Alkaline Phosphatase: 112 IU/L (ref 39–117)
BUN / CREAT RATIO: 8 — AB (ref 10–22)
BUN: 6 mg/dL — AB (ref 8–27)
Bilirubin Total: 0.6 mg/dL (ref 0.0–1.2)
CO2: 25 mmol/L (ref 18–29)
Calcium: 8.7 mg/dL (ref 8.6–10.2)
Chloride: 100 mmol/L (ref 97–108)
Creatinine, Ser: 0.74 mg/dL — ABNORMAL LOW (ref 0.76–1.27)
GFR calc non Af Amer: 91 mL/min/{1.73_m2} (ref >59)
GFR, EST AFRICAN AMERICAN: 105 mL/min/{1.73_m2} (ref >59)
GLUCOSE: 96 mg/dL (ref 65–99)
Globulin, Total: 2.1 g/dL (ref 1.5–4.5)
Potassium: 4.4 mmol/L (ref 3.5–5.2)
Sodium: 141 mmol/L (ref 134–144)
TOTAL PROTEIN: 5.9 g/dL — AB (ref 6.0–8.5)

## 2015-05-06 LAB — LIPID PANEL
CHOL/HDL RATIO: 2 ratio (ref 0.0–5.0)
Cholesterol, Total: 120 mg/dL (ref 100–199)
HDL: 61 mg/dL (ref >39)
LDL CALC: 32 mg/dL (ref 0–99)
TRIGLYCERIDES: 136 mg/dL (ref 0–149)
VLDL Cholesterol Cal: 27 mg/dL (ref 5–40)

## 2015-05-06 LAB — URIC ACID: Uric Acid: 5.7 mg/dL (ref 3.7–8.6)

## 2015-05-07 ENCOUNTER — Telehealth: Payer: Self-pay | Admitting: Family Medicine

## 2015-05-07 MED ORDER — ALBUTEROL SULFATE HFA 108 (90 BASE) MCG/ACT IN AERS: 2.0000 | INHALATION_SPRAY | Freq: Four times a day (QID) | RESPIRATORY_TRACT | Status: AC | PRN

## 2015-05-07 NOTE — Telephone Encounter (Signed)
Spoke with pt regarding spirometry results Pt requested refill on Albuterol rescue inhaler Rx okayed per Dr Dudley Major into pharmacy

## 2015-05-13 ENCOUNTER — Telehealth: Payer: Self-pay | Admitting: *Deleted

## 2015-05-13 NOTE — Telephone Encounter (Signed)
Pt notified of results Verbalizes understanding 

## 2015-05-13 NOTE — Telephone Encounter (Signed)
-----   Message from Claretta Fraise, MD sent at 05/06/2015  8:18 AM EDT ----- Wesley [name],    Your lab result is normal.Some minor variations that are not significant are commonly marked abnormal, but do not represent any medical problem for you.  However, your breathing test showed that your COPD needs a little better treatment. I sent in a prescription for a higher dose of your advil inhaler. That should give you a little more wind to help you get around better.  Best regards, Claretta Fraise, M.D.

## 2015-05-28 DIAGNOSIS — G44219 Episodic tension-type headache, not intractable: Secondary | ICD-10-CM | POA: Diagnosis not present

## 2015-06-19 ENCOUNTER — Telehealth: Payer: Self-pay | Admitting: Pediatrics

## 2015-06-19 NOTE — Telephone Encounter (Signed)
EMS called [facility] after hours line: pt was found in home deceased, thought to be dead apprx 24-48h prior to them arriving. Pt's son is aware. No suspicious circumstances per EMS. Given co-morbidities including CAD, agreed with EMS that case does not need to be referred to medical examiner. They will send [facility] the death certificate.

## 2015-07-17 DEATH — deceased

## 2015-10-21 IMAGING — CR DG CHEST 2V
2 series · 2 of 2 positions shown · non-contrast
Comparison: [DATE]

CLINICAL DATA: Shortness of breath.

EXAM:
CHEST  2 VIEW

[view not recorded (1 of 2)]
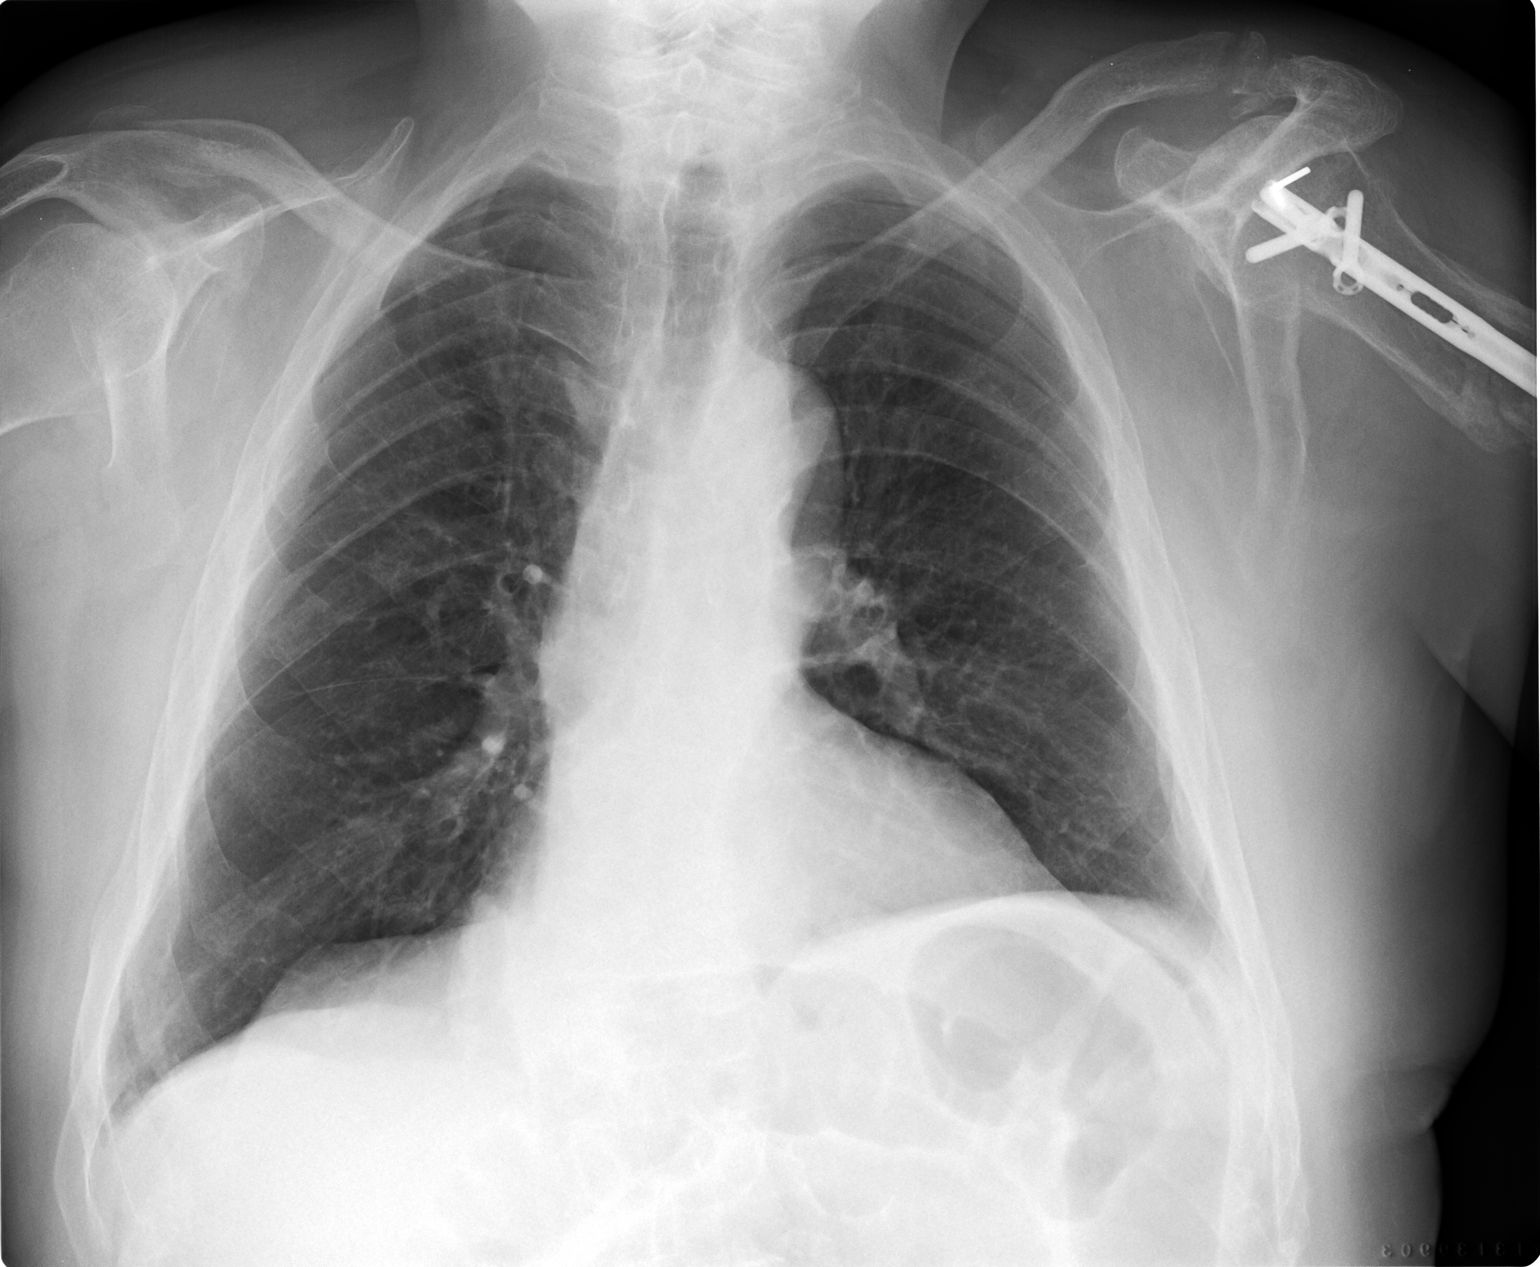

[view not recorded (2 of 2)]
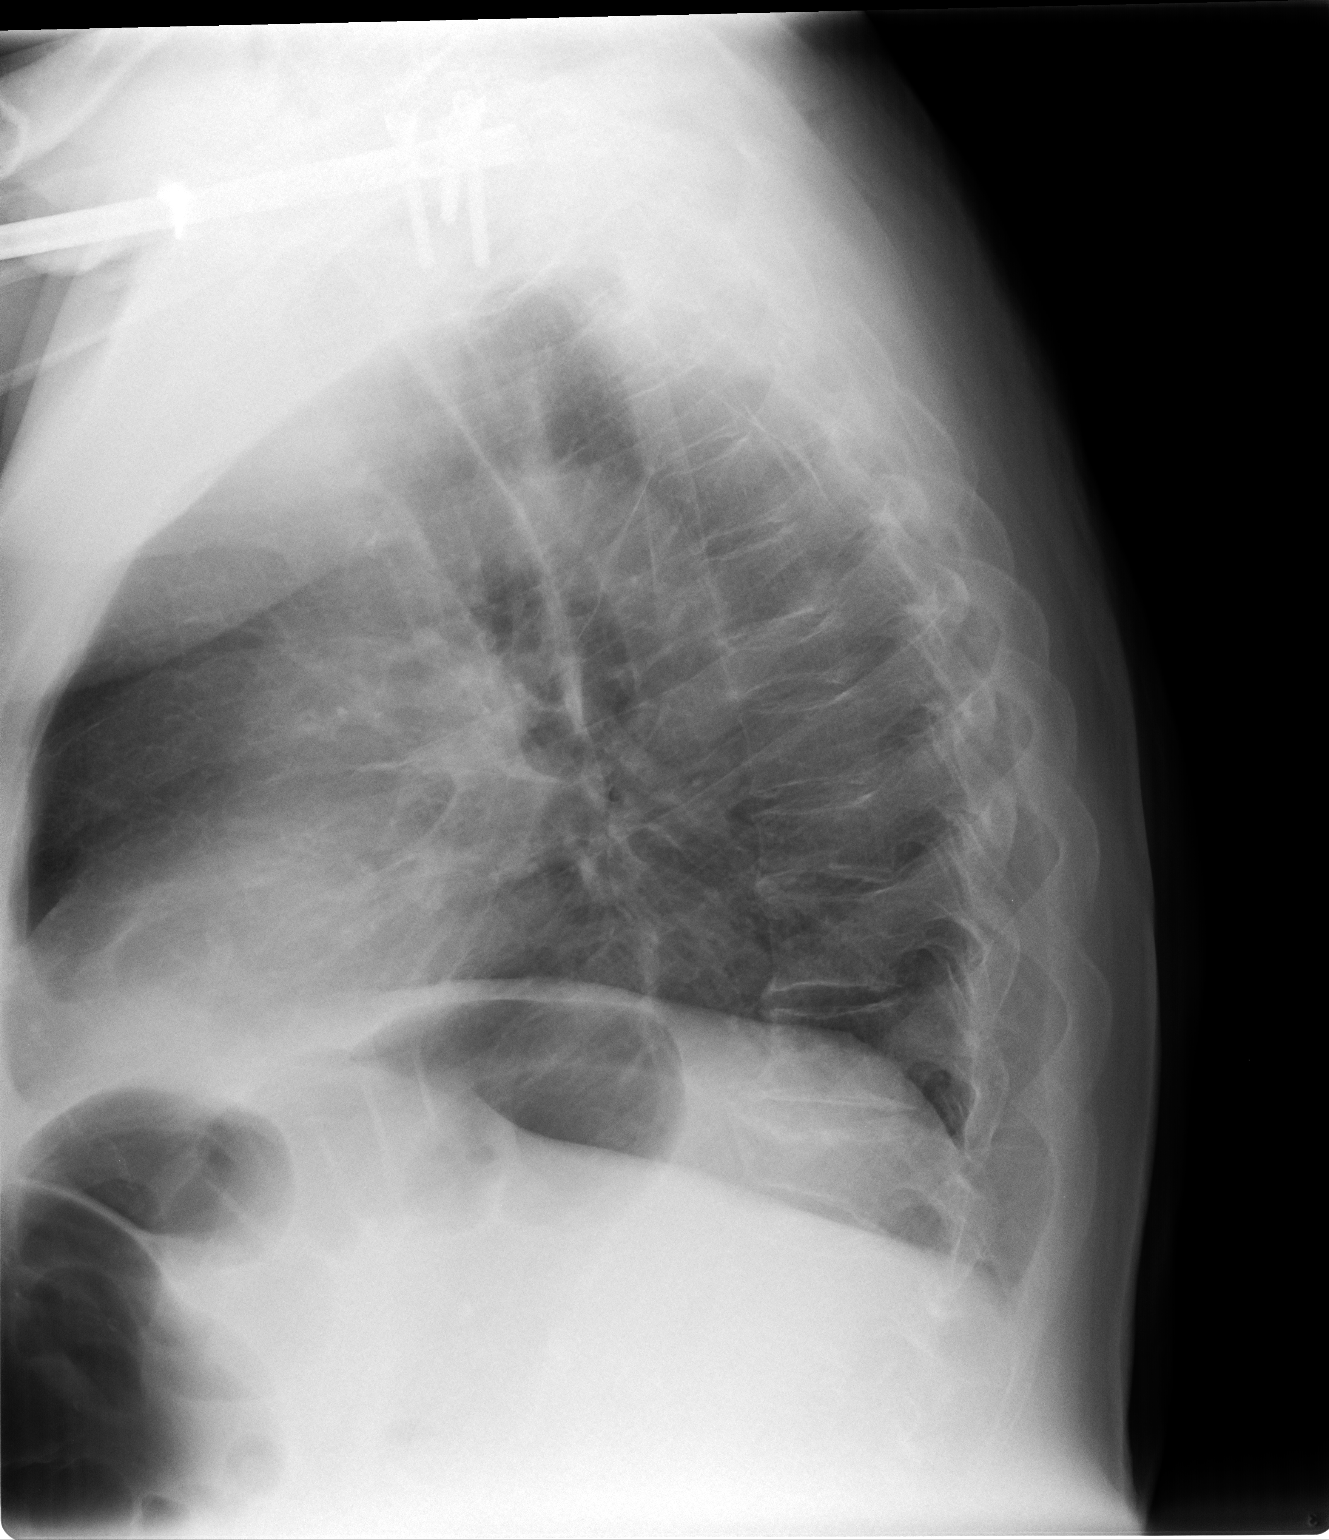

[2 of 2 positions shown; findings below may reference images not displayed]

FINDINGS: Heart and mediastinal contours are within normal limits. No focal
opacities or effusions. No acute bony abnormality. Evidence of
surgical changes and internal fixation across a proximal left
humeral fracture.
IMPRESSION: No active cardiopulmonary disease.

## 2015-11-04 ENCOUNTER — Ambulatory Visit: Payer: Medicare Other | Admitting: Family Medicine

## 2016-03-24 IMAGING — CR DG CHEST 2V
2 series · 2 of 2 positions shown · non-contrast
Comparison: PA and lateral chest 09/25/2013 and 07/23/2011.

CLINICAL DATA: Cough.

EXAM:
CHEST  2 VIEW

[view not recorded (1 of 2)]
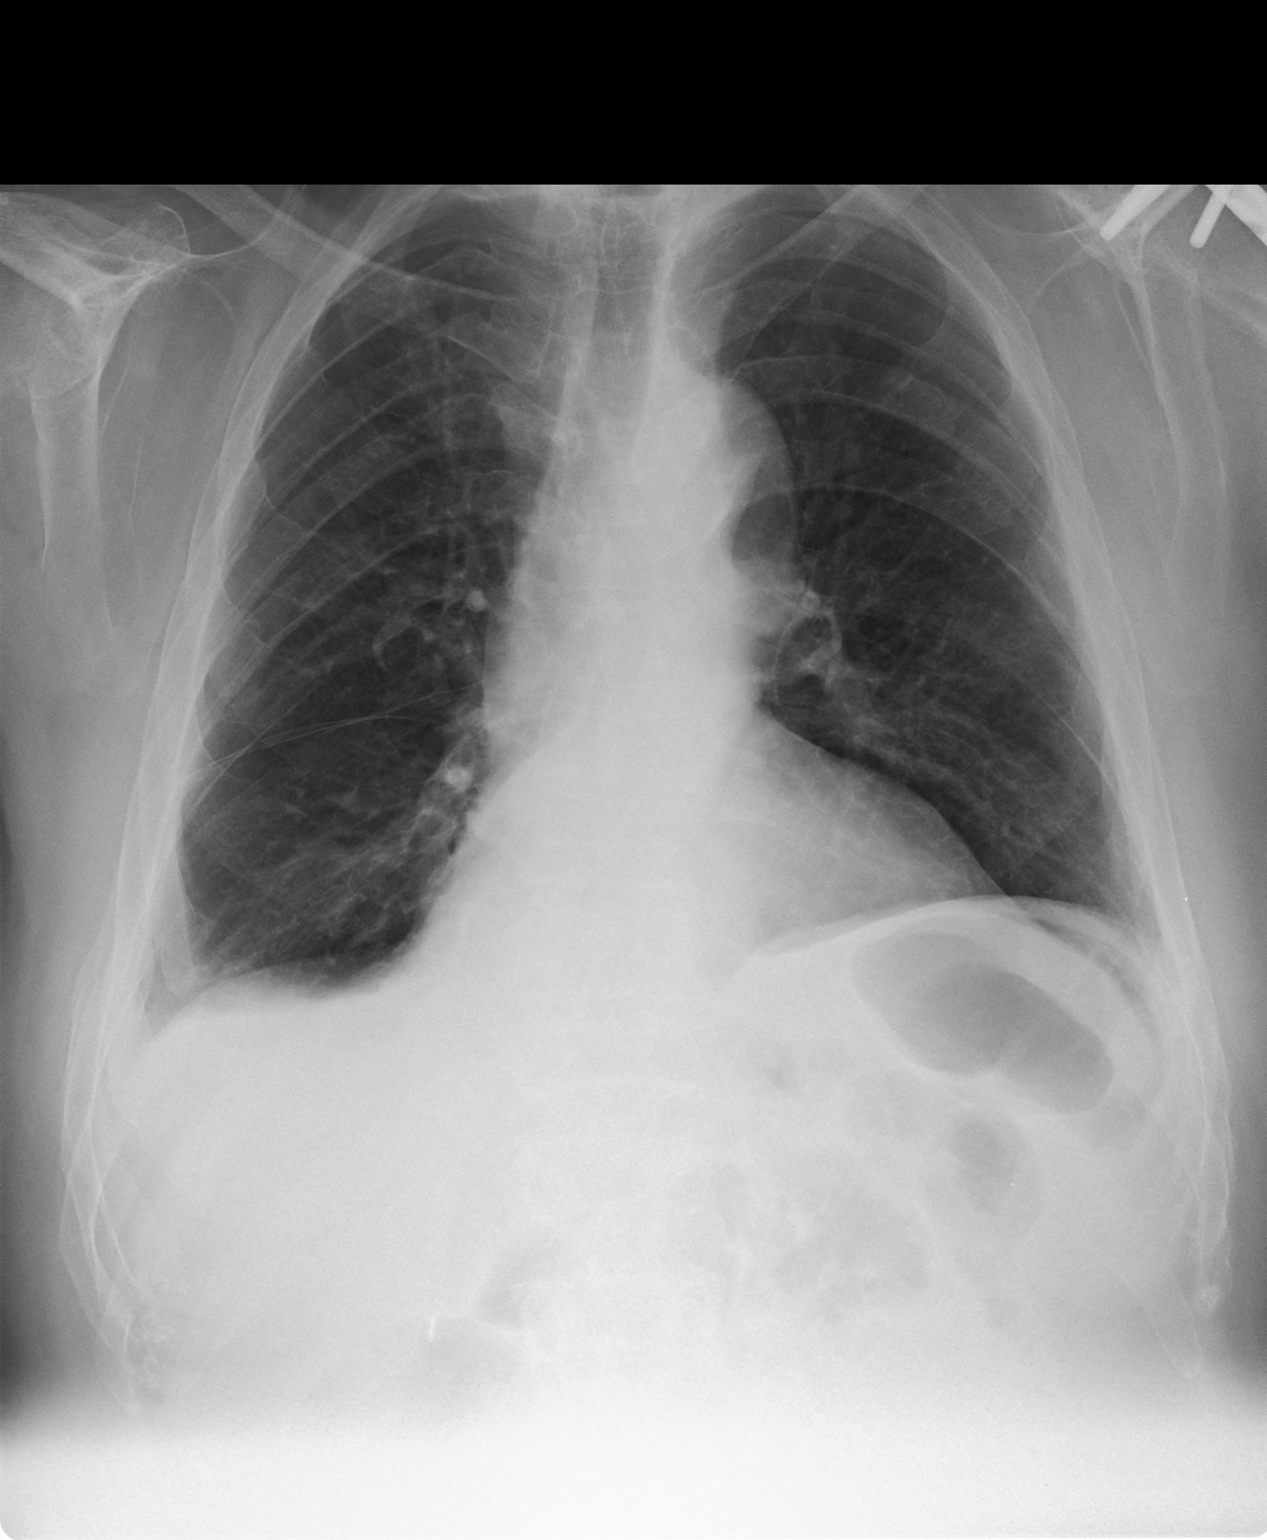

[view not recorded (2 of 2)]
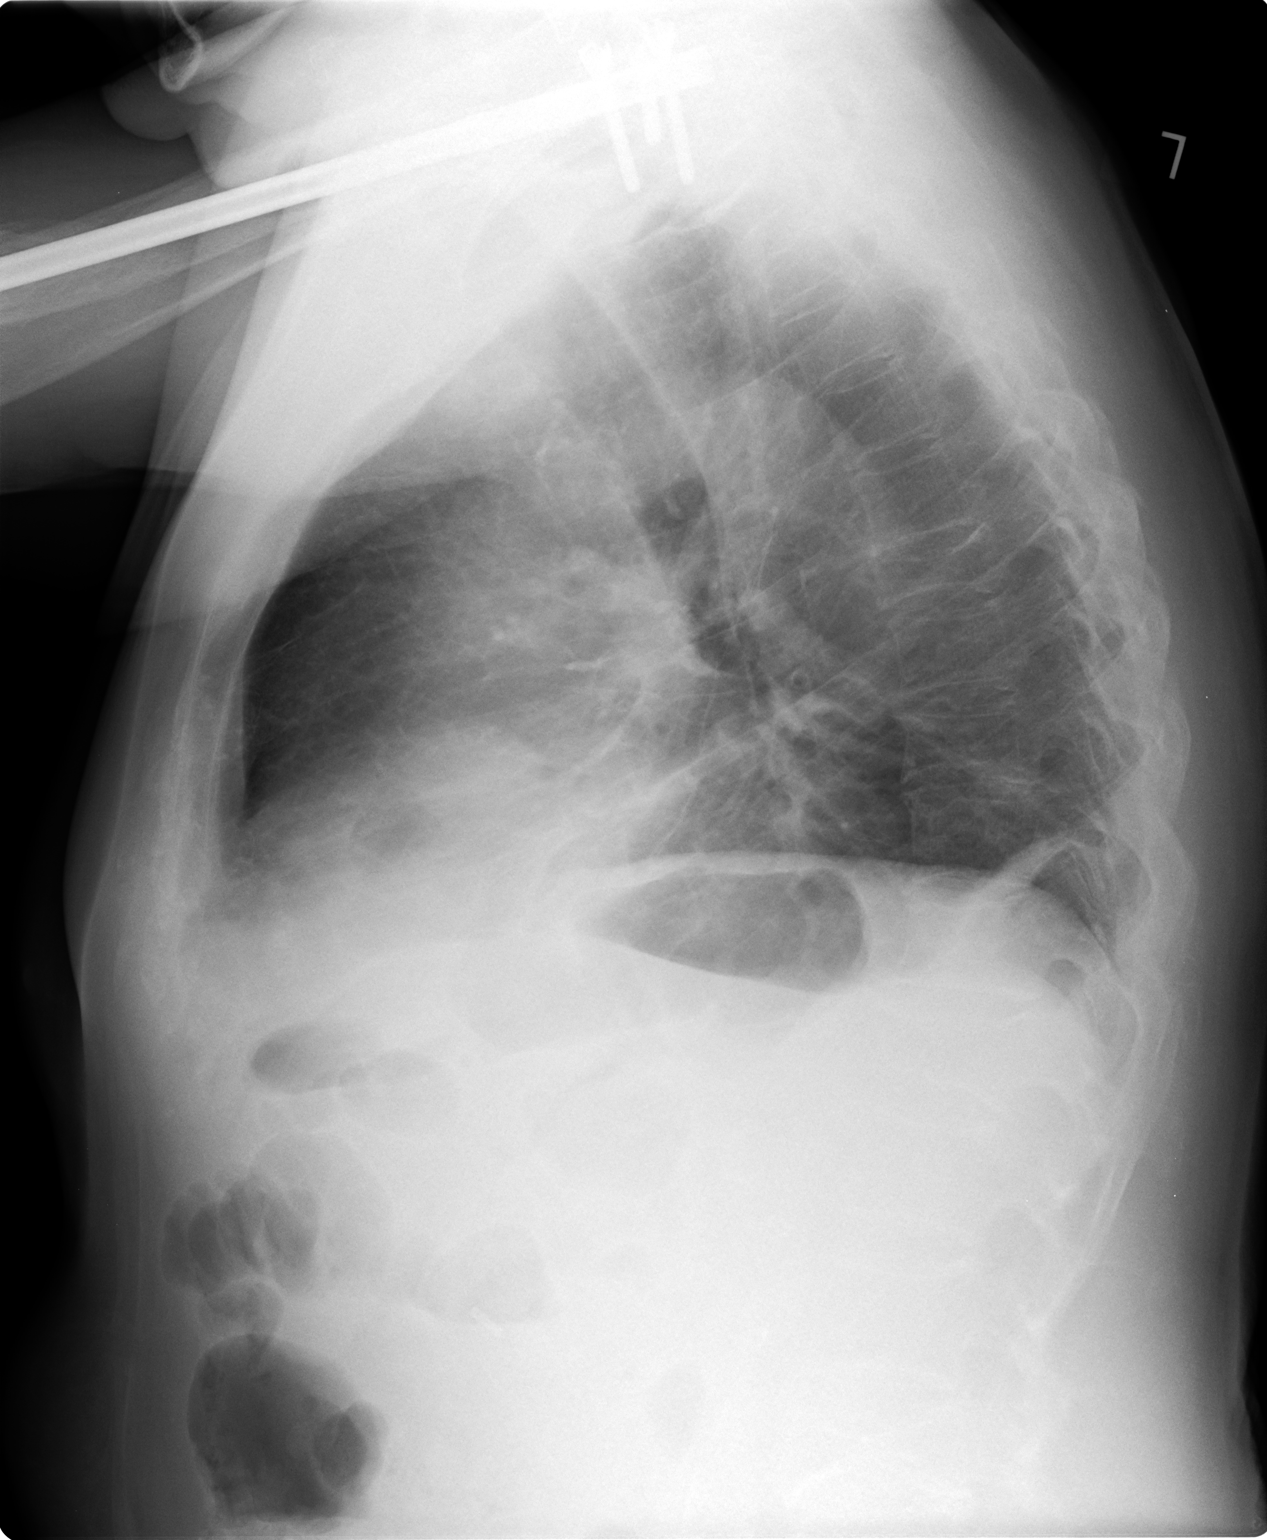

[2 of 2 positions shown; findings below may reference images not displayed]

FINDINGS: Subsegmental atelectasis is seen in the right lung base and there
may be a trace right pleural effusion. Left lung is clear. Heart
size is normal. No pneumothorax.
IMPRESSION: Subsegmental atelectasis right lung base and possible trace right
pleural effusion.
